# Patient Record
Sex: Male | Born: 1985 | Race: Black or African American | Hispanic: No | Marital: Single | Smoking: Current some day smoker
Health system: Southern US, Community
[De-identification: ages and names within clinical notes are randomized; demographics above are authoritative.]

## PROBLEM LIST (undated history)

## (undated) DIAGNOSIS — K219 Gastro-esophageal reflux disease without esophagitis: Secondary | ICD-10-CM

## (undated) DIAGNOSIS — M1990 Unspecified osteoarthritis, unspecified site: Secondary | ICD-10-CM

## (undated) DIAGNOSIS — R011 Cardiac murmur, unspecified: Secondary | ICD-10-CM

## (undated) HISTORY — PX: APPENDECTOMY: SHX54

---

## 2018-07-09 ENCOUNTER — Emergency Department (HOSPITAL_COMMUNITY): Payer: No Typology Code available for payment source

## 2018-07-09 ENCOUNTER — Encounter (HOSPITAL_COMMUNITY): Payer: Self-pay

## 2018-07-09 ENCOUNTER — Emergency Department (HOSPITAL_COMMUNITY)
Admission: EM | Admit: 2018-07-09 | Discharge: 2018-07-09 | Disposition: A | Discharge status: Home / Self Care | Payer: No Typology Code available for payment source | Attending: Emergency Medicine | Admitting: Emergency Medicine

## 2018-07-09 ENCOUNTER — Other Ambulatory Visit: Payer: Self-pay

## 2018-07-09 DIAGNOSIS — W19XXXA Unspecified fall, initial encounter: Secondary | ICD-10-CM

## 2018-07-09 DIAGNOSIS — S8991XA Unspecified injury of right lower leg, initial encounter: Secondary | ICD-10-CM

## 2018-07-09 DIAGNOSIS — F17210 Nicotine dependence, cigarettes, uncomplicated: Secondary | ICD-10-CM | POA: Insufficient documentation

## 2018-07-09 DIAGNOSIS — Y92812 Truck as the place of occurrence of the external cause: Secondary | ICD-10-CM | POA: Diagnosis not present

## 2018-07-09 DIAGNOSIS — Y9301 Activity, walking, marching and hiking: Secondary | ICD-10-CM | POA: Insufficient documentation

## 2018-07-09 DIAGNOSIS — S3991XA Unspecified injury of abdomen, initial encounter: Secondary | ICD-10-CM | POA: Diagnosis not present

## 2018-07-09 DIAGNOSIS — W1789XA Other fall from one level to another, initial encounter: Secondary | ICD-10-CM | POA: Insufficient documentation

## 2018-07-09 DIAGNOSIS — Y998 Other external cause status: Secondary | ICD-10-CM | POA: Diagnosis not present

## 2018-07-09 DIAGNOSIS — F1210 Cannabis abuse, uncomplicated: Secondary | ICD-10-CM | POA: Diagnosis not present

## 2018-07-09 HISTORY — DX: Cardiac murmur, unspecified: R01.1

## 2018-07-09 MED ORDER — HYDROCODONE-ACETAMINOPHEN 5-325 MG PO TABS: 1.0000 | ORAL_TABLET | ORAL | 0 refills | Status: DC | PRN

## 2018-07-09 MED ORDER — METHOCARBAMOL 500 MG PO TABS: 500.0000 mg | ORAL_TABLET | Freq: Two times a day (BID) | ORAL | 0 refills | Status: DC

## 2018-07-09 NOTE — ED Provider Notes (Signed)
[facility]-EMERGENCY DEPT Provider Note   CSN: [id number] Arrival date & time: 07/09/18  1320     History   Chief Complaint Chief Complaint  Patient presents with  . Fall    HPI Jeffrey Bennett is a 32 y.o. male.  HPI  Jeffrey Bennett is a 32 y.o. male, presenting to the ED with a fall that occurred around 7 AM this morning.  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  He complains of pain to the right knee and the left flank.  He also had pain in the left shoulder, but this has resolved. Pain in the left flank is described as a soreness, moderate, nonradiating.  Pain in the right knee is noted as a throbbing, moderate to severe, nonradiating.  Denies head injury, LOC, neck/back pain, hematuria, nausea/vomiting, or any other complaints.  Past Medical History:  Diagnosis Date  . Heart murmur     There are no active problems to display for this patient.   Past Surgical History:  Procedure Laterality Date  . APPENDECTOMY          Home Medications    Prior to Admission medications   Medication Sig Start Date End Date Taking? Authorizing Provider  HYDROcodone-acetaminophen (NORCO/VICODIN) 5-325 MG tablet Take 1-2 tablets by mouth every 4 (four) hours as needed for severe pain. 07/09/18   Joy, Shawn C, PA-C  methocarbamol (ROBAXIN) 500 MG tablet Take 1 tablet (500 mg total) by mouth 2 (two) times daily. 07/09/18   Anselm PancoastJoy, Shawn C, PA-C    Family History History reviewed. No pertinent family history.  Social History Social History   Tobacco Use  . Smoking status: Current Every Day Smoker    Packs/day: 0.50    Types: Cigarettes  . Smokeless tobacco: Never Used  Substance Use Topics  . Alcohol use: Yes  . Drug use: Yes    Types: Marijuana     Allergies   Trimox [amoxicillin]   Review of Systems Review of Systems  Respiratory: Negative for shortness of breath.   Cardiovascular:  Negative for chest pain.  Gastrointestinal: Negative for nausea and vomiting.  Genitourinary: Positive for flank pain. Negative for hematuria.  Musculoskeletal: Positive for arthralgias. Negative for back pain and neck pain.  Skin: Negative for wound.  Neurological: Negative for dizziness, weakness, light-headedness, numbness and headaches.  All other systems reviewed and are negative.    Physical Exam Updated Vital Signs BP (!) 143/88   Pulse 77   Temp 98.5 F (36.9 C) (Oral)   Resp 16   Ht 6\' 1"  (1.854 m)   Wt 86.2 kg   SpO2 100%   BMI 25.07 kg/m   Physical Exam  Constitutional: He appears well-developed and well-nourished. No distress.  HENT:  Head: Normocephalic and atraumatic.  Eyes: Conjunctivae are normal.  Neck: Neck supple.  Cardiovascular: Normal rate, regular rhythm, normal heart sounds and intact distal pulses.  Pulmonary/Chest: Effort normal and breath sounds normal. No respiratory distress.  Patient speaks in full sentences without difficulty.  No noted increased work of breathing.  Abdominal: Soft. There is tenderness. There is no guarding.    Tenderness in the area indicated without ecchymosis, swelling, wounds, or other abnormalities.  Musculoskeletal: He exhibits no edema.  Tenderness to the medial and anterior right knee.  Patella appears to be in correct anatomical position.  No noted laxity, deformity, or swelling. Painful range of motion in  the left knee. Range of motion intact in the left hip and ankle without pain or other abnormality.  Lymphadenopathy:    He has no cervical adenopathy.  Neurological: He is alert.  Sensation grossly intact to light touch in the lower extremities bilaterally. No saddle anesthesias. Strength 5/5 with flexion and extension at the bilateral hips, knees, and ankles. Limping, antalgic gait.  Skin: Skin is warm and dry. He is not diaphoretic.  Psychiatric: He has a normal mood and affect. His behavior is normal.    Nursing note and vitals reviewed.    ED Treatments / Results  Labs (all labs ordered are listed, but only abnormal results are displayed) Labs Reviewed - No data to display  EKG None  Radiology Dg Shoulder Left  Result Date: 07/09/2018 CLINICAL DATA:  Diffuse shoulder pain after fall at work. EXAM: LEFT SHOULDER - 2+ VIEW COMPARISON:  None. FINDINGS: There is no evidence of fracture or dislocation. There is no evidence of arthropathy or other focal bone abnormality. Soft tissues are unremarkable. IMPRESSION: Negative. Electronically Signed   By: Obie Dredge M.D.   On: 07/09/2018 14:50   Dg Knee Complete 4 Views Right  Result Date: 07/09/2018 CLINICAL DATA:  Fall with right knee pain.  Initial encounter. EXAM: RIGHT KNEE - COMPLETE 4+ VIEW COMPARISON:  None. FINDINGS: No evidence of fracture, dislocation, or joint effusion. No evidence of arthropathy or other focal bone abnormality. Soft tissues are unremarkable. IMPRESSION: Negative. Electronically Signed   By: Marnee Spring M.D.   On: 07/09/2018 14:51    Procedures Procedures (including critical care time)  Medications Ordered in ED Medications - No data to display   Initial Impression / Assessment and Plan / ED Course  I have reviewed the triage vital signs and the nursing notes.  Pertinent labs & imaging results that were available during my care of the patient were reviewed by me and considered in my medical decision making (see chart for details).     Patient presents with injuries from a fall.  No acute abnormalities on x-rays of the left shoulder and knee.  Patient does have left flank tenderness without other abnormalities noted.  He is nontoxic-appearing, not tachycardic, and not hypotensive.  Shared decision making regarding abdominal CT, patient opted against. The patient was given instructions for home care as well as strict return precautions. Patient voices understanding of these instructions, accepts the  plan, and is comfortable with discharge.   Vitals:   07/09/18 1327 07/09/18 1332 07/09/18 1553  BP: (!) 143/88  140/88  Pulse: 77  (!) 51  Resp: 16  18  Temp: 98.5 F (36.9 C)    TempSrc: Oral    SpO2: 100%  100%  Weight:  86.2 kg   Height:  6\' 1"  (1.854 m)      Final Clinical Impressions(s) / ED Diagnoses   Final diagnoses:  Fall, initial encounter  Injury of right knee, initial encounter  Injury of flank, initial encounter    ED Discharge Orders         Ordered    methocarbamol (ROBAXIN) 500 MG tablet  2 times daily     07/09/18 1537    HYDROcodone-acetaminophen (NORCO/VICODIN) 5-325 MG tablet  Every 4 hours PRN     07/09/18 1537           Joy, Hillard Danker, PA-C 07/09/18 1749    Charlynne Pander, MD 07/10/18 [phone]

## 2018-07-09 NOTE — Discharge Instructions (Addendum)
You have been seen today for a knee injury. There were no acute abnormalities on the x-rays, including no sign of fracture or dislocation, however, there could be injuries to the soft tissues, such as the ligaments or tendons that are not seen on xrays. There could also be what are called occult fractures that are small fractures not seen on xray. Antiinflammatory medications: Take 600 mg of ibuprofen every 6 hours or 440 mg (over the counter dose) to 500 mg (prescription dose) of naproxen every 12 hours for the next 3 days. After this time, these medications may be used as needed for pain. Take these medications with food to avoid upset stomach. Choose only one of these medications, do not take them together. Acetaminophen (generic for Tylenol): Should you continue to have additional pain while taking the ibuprofen or naproxen, you may add in acetaminophen as needed. Your daily total maximum amount of acetaminophen from all sources should be limited to 4000mg /day for persons without liver problems, or 2000mg /day for those with liver problems. Vicodin: May take Vicodin (hydrocodone-acetaminophen) as needed for severe pain.  Do not drive or perform other dangerous activities while taking the Vicodin.  Please note that each pill of Vicodin contains 325 mg of acetaminophen (Tylenol) and the above dosage limits apply. Robaxin: Robaxin is a muscle relaxer and can help relieve stiff muscles or muscle spasms.  Do not drive or perform other dangerous activities while taking the Robaxin. Ice: May apply ice to the area over the next 24 hours for 15 minutes at a time to reduce swelling. Elevation: Keep the extremity elevated as often as possible to reduce pain and inflammation. Support: Wear the knee immobilizer for support and comfort. Wear this until pain resolves. You will be weight-bearing as tolerated, which means you can slowly start to put weight on the extremity and increase amount and frequency as pain  allows. Exercises: Start by performing these exercises a few times a week, increasing the frequency until you are performing them twice daily.  Follow up: If symptoms are improving, you may follow up with your primary care provider for any continued management. If symptoms are not starting to improve within a week, you should follow up with the orthopedic specialist within two weeks. Return: Return to the ED for numbness, weakness, increasing pain, overall worsening symptoms, loss of function, or if symptoms are not improving, you have tried to follow up with the orthopedic specialist, and have been unable to do so.  You should also return should your flank pain worsen, you have uncontrolled vomiting, you have abdominal swelling, or any other major concerns.

## 2018-07-09 NOTE — ED Triage Notes (Signed)
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 Patient c/o right knee, left flank area, left shoulder. Patient also reports that he hit his head, but no LOC.

## 2018-07-14 ENCOUNTER — Encounter (HOSPITAL_COMMUNITY): Payer: Self-pay

## 2018-07-14 ENCOUNTER — Emergency Department (HOSPITAL_COMMUNITY)
Admission: EM | Admit: 2018-07-14 | Discharge: 2018-07-14 | Disposition: A | Discharge status: Home / Self Care | Payer: No Typology Code available for payment source | Attending: Emergency Medicine | Admitting: Emergency Medicine

## 2018-07-14 DIAGNOSIS — F17210 Nicotine dependence, cigarettes, uncomplicated: Secondary | ICD-10-CM | POA: Diagnosis not present

## 2018-07-14 DIAGNOSIS — M25561 Pain in right knee: Secondary | ICD-10-CM | POA: Insufficient documentation

## 2018-07-14 MED ORDER — HYDROCODONE-ACETAMINOPHEN 5-325 MG PO TABS: 2.0000 | ORAL_TABLET | ORAL | 0 refills | Status: DC

## 2018-07-14 MED ORDER — KETOROLAC TROMETHAMINE 15 MG/ML IJ SOLN
15.0000 mg | Freq: Once | INTRAMUSCULAR | Status: AC
  Administered 2018-07-14: 15 mg via INTRAMUSCULAR
  Filled 2018-07-14: qty 1

## 2018-07-14 NOTE — ED Triage Notes (Signed)
Pt is c/o of rt knee pain 3/10 and reports that he is unable to bend his knee. Recent injury on 07/09/18 at work.

## 2018-07-14 NOTE — ED Provider Notes (Signed)
[facility]-EMERGENCY DEPT Provider Note   CSN: [id number] Arrival date & time: 07/14/18  1429     History   Chief Complaint Chief Complaint  Patient presents with  . Knee Pain    HPI Jeffrey Bennett is a 32 y.o. male.  HPI  Pt is a 32 y/o male with a h/o heart murmur who presents to the ED today c/o right knee pain that began 4 days ago after he fell in a [employer] truck. Pt was evaluated in the ED at that time and had negative xrays of the right knee. He was placed in right knee immobilizer and given crutches. States he was supposed to go back to work today however he is still having severe pain in the right knee and cannot go back to work yet. He is presenting today for re-evaluation of his continued pain. Staes pain is constant and severe in nature. He has been taking hydrocodone and robaxin with no significant relief. States he has been doing warm/cool compresses which has improved swelling somewhat but swelling not completely resolved. He denies fevers, chills, and continued left sided flank pain that has been present since the fall. Pain present with coughing and movement, however resolved without movement. Denies hematuria.   Chart reviewed from patient's visit on 07/10/2018.  Reviewed x-rays from that day which were negative.  Reviewed that patient refused abdominal CT scan.  Past Medical History:  Diagnosis Date  . Heart murmur     There are no active problems to display for this patient.   Past Surgical History:  Procedure Laterality Date  . APPENDECTOMY          Home Medications    Prior to Admission medications   Medication Sig Start Date End Date Taking? Authorizing Provider  methocarbamol (ROBAXIN) 500 MG tablet Take 1 tablet (500 mg total) by mouth 2 (two) times daily. 07/09/18  Yes Joy, Shawn C, PA-C  Tetrahydrozoline HCl (VISINE OP) Place 1-2 drops into both eyes daily as needed (dry eyes).   Yes [provider]    HYDROcodone-acetaminophen (NORCO/VICODIN) 5-325 MG tablet Take 2 tablets by mouth every 8 (eight) weeks. 07/14/18   Fox Salminen S, PA-C    Family History History reviewed. No pertinent family history.  Social History Social History   Tobacco Use  . Smoking status: Current Every Day Smoker    Packs/day: 0.50    Types: Cigarettes  . Smokeless tobacco: Never Used  Substance Use Topics  . Alcohol use: Yes  . Drug use: Not Currently     Allergies   Trimox [amoxicillin]   Review of Systems Review of Systems  Constitutional: Negative for chills and fever.  Gastrointestinal: Negative for abdominal pain, constipation, diarrhea, nausea and vomiting.  Genitourinary: Positive for flank pain.  Musculoskeletal:       Knee pain  Skin: Negative for color change.   Physical Exam Updated Vital Signs BP (!) 146/76   Pulse 65   Temp 98.2 F (36.8 C)   Resp 16   SpO2 97%   Physical Exam  Constitutional: He is oriented to person, place, and time. He appears well-developed and well-nourished. No distress.  Eyes: Conjunctivae are normal.  Cardiovascular: Normal rate.  Pulmonary/Chest: Effort normal.  Abdominal: Soft.  Musculoskeletal:  ttp along the left medial joint line of the right knee. No warmth, erythema, or swelling. No obvious effusion. Decreased ROM With flexion of the knee. Extension intact. No obvious deformity. Normal sensation.  Neurological: He is alert  and oriented to person, place, and time.  Skin: Skin is warm and dry. Capillary refill takes less than 2 seconds.     ED Treatments / Results  Labs (all labs ordered are listed, but only abnormal results are displayed) Labs Reviewed - No data to display  EKG None  Radiology No results found.  Procedures Procedures (including critical care time)  Medications Ordered in ED Medications  ketorolac (TORADOL) 15 MG/ML injection 15 mg (has no administration in time range)     Initial Impression /  Assessment and Plan / ED Course  I have reviewed the triage vital signs and the nursing notes.  Pertinent labs & imaging results that were available during my care of the patient were reviewed by me and considered in my medical decision making (see chart for details).    Final Clinical Impressions(s) / ED Diagnoses   Final diagnoses:  Acute pain of right knee   Patient presented with continued right knee pain that began after an injury that occurred 4 days ago.  He had negative x-rays 4 days ago.  He has had continued pain and is presented today because he ran out of pain medications and is scheduled to go back to work but he does not feel that he is ready to go back to work.  Will provide short course of additional pain medications and referral to orthopedics.  Patient has no signs of septic arthritis on exam, though I discussed signs and symptoms that would require him to return to the ER immediately and he voiced an understanding of this.  We will also give patient knee sleeve for comfort and advised him to continue using crutches.  Advised to follow-up with orthopedics and return to the ER for any new or worsening symptoms in the meantime.  ED Discharge Orders         Ordered    HYDROcodone-acetaminophen (NORCO/VICODIN) 5-325 MG tablet  Every 8 weeks     07/14/18 [address] Jameison Haji S, PA-C 07/14/18 1559    Benjiman Core, MD 07/14/18 1609

## 2018-07-14 NOTE — Discharge Instructions (Addendum)
You may alternate taking Tylenol and Ibuprofen as needed for pain control. You may take 400-600 mg of ibuprofen every 6 hours and [phone] mg of Tylenol every 6 hours. Do not exceed 4000 mg of Tylenol daily as this can lead to liver damage. Also, make sure to take Ibuprofen with meals as it can cause an upset stomach. Do not take other NSAIDs while taking Ibuprofen such as (Aleve, Naprosyn, Aspirin, Celebrex, etc) and do not take more than the prescribed dose as this can lead to ulcers and bleeding in your GI tract. You may use warm and cold compresses to help with your symptoms.   Please follow up with your primary doctor or with orthopedics within the next 7-10 days for re-evaluation and further treatment of your symptoms.   Please return to the ER sooner if you have any new or worsening symptoms.

## 2019-10-31 ENCOUNTER — Other Ambulatory Visit: Payer: Self-pay

## 2019-10-31 ENCOUNTER — Emergency Department (HOSPITAL_COMMUNITY): Payer: Self-pay

## 2019-10-31 ENCOUNTER — Encounter (HOSPITAL_COMMUNITY): Payer: Self-pay | Admitting: Emergency Medicine

## 2019-10-31 ENCOUNTER — Emergency Department (HOSPITAL_COMMUNITY)
Admission: EM | Admit: 2019-10-31 | Discharge: 2019-10-31 | Disposition: A | Discharge status: Home / Self Care | Payer: Self-pay | Attending: Emergency Medicine | Admitting: Emergency Medicine

## 2019-10-31 DIAGNOSIS — L03011 Cellulitis of right finger: Secondary | ICD-10-CM

## 2019-10-31 DIAGNOSIS — Z79899 Other long term (current) drug therapy: Secondary | ICD-10-CM | POA: Insufficient documentation

## 2019-10-31 DIAGNOSIS — L600 Ingrowing nail: Secondary | ICD-10-CM

## 2019-10-31 DIAGNOSIS — F17210 Nicotine dependence, cigarettes, uncomplicated: Secondary | ICD-10-CM | POA: Insufficient documentation

## 2019-10-31 MED ORDER — LIDOCAINE HCL (PF) 1 % IJ SOLN
10.0000 mL | Freq: Once | INTRAMUSCULAR | Status: AC
  Administered 2019-10-31: 10 mL via INTRADERMAL
  Filled 2019-10-31: qty 30

## 2019-10-31 MED ORDER — HYDROCODONE-ACETAMINOPHEN 5-325 MG PO TABS: 1.0000 | ORAL_TABLET | Freq: Four times a day (QID) | ORAL | 0 refills | Status: DC | PRN

## 2019-10-31 MED ORDER — CEPHALEXIN 500 MG PO CAPS: 500.0000 mg | ORAL_CAPSULE | Freq: Three times a day (TID) | ORAL | 0 refills | Status: AC

## 2019-10-31 NOTE — Discharge Instructions (Signed)
Keep the finger tip clean and dry. Keep it covered while at work. Take the antibiotic to prevent infection. Norco for severe pain when not working or driving.

## 2019-10-31 NOTE — ED Triage Notes (Signed)
Pt reports having pain and swelling to right index finger over the last 4 days. Pt denies any injury.

## 2019-10-31 NOTE — ED Provider Notes (Signed)
[facility] DEPT Provider Note   CSN: [id number] Arrival date & time: 10/31/19  0404     History Chief Complaint  Patient presents with  . Finger Injury    Jeffrey Bennett is a 33 y.o. male.  Patient to ED with painful swelling around nail of right index finger, progressing over 4 days. No drainage or redness. No known injury. 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   The history is provided by the patient. No language interpreter was used.       Past Medical History:  Diagnosis Date  . Heart murmur     There are no problems to display for this patient.   Past Surgical History:  Procedure Laterality Date  . APPENDECTOMY         History reviewed. No pertinent family history.  Social History   Tobacco Use  . Smoking status: Current Every Day Smoker    Packs/day: 0.50    Types: Cigarettes  . Smokeless tobacco: Never Used  Substance Use Topics  . Alcohol use: Yes  . Drug use: Not Currently    Home Medications Prior to Admission medications   Medication Sig Start Date End Date Taking? Authorizing Provider  cephALEXin (KEFLEX) 500 MG capsule Take 1 capsule (500 mg total) by mouth 3 (three) times daily for 5 days. 10/31/19 11/05/19  Charlann Lange, PA-C  HYDROcodone-acetaminophen (NORCO/VICODIN) 5-325 MG tablet Take 1 tablet by mouth every 6 (six) hours as needed. 10/31/19   Charlann Lange, PA-C  methocarbamol (ROBAXIN) 500 MG tablet Take 1 tablet (500 mg total) by mouth 2 (two) times daily. 07/09/18   Joy, Shawn C, PA-C  Tetrahydrozoline HCl (VISINE OP) Place 1-2 drops into both eyes daily as needed (dry eyes).    [provider]    Allergies    Trimox [amoxicillin]  Review of Systems   Review of Systems  Constitutional: Negative for fever.  Musculoskeletal:       See HPI  Skin: Positive for wound.  Neurological: Negative for numbness.    Physical Exam Updated Vital Signs BP (!) 142/87 (BP Location: Left Arm)   Pulse  65   Temp 97.9 F (36.6 C) (Oral)   Resp 16   Ht 6\' 1"  (1.854 m)   Wt 86.2 kg   SpO2 100%   BMI 25.07 kg/m   Physical Exam Constitutional:      Appearance: He is well-developed.  Pulmonary:     Effort: Pulmonary effort is normal.  Musculoskeletal:        General: Normal range of motion.     Cervical back: Normal range of motion.     Comments: Right index finger swollen around radial side and inferior cuticle. Minimal fluctuance. No bleeding. No subungual hematoma.   Skin:    General: Skin is warm and dry.  Neurological:     Mental Status: He is alert and oriented to person, place, and time.     ED Results / Procedures / Treatments   Labs (all labs ordered are listed, but only abnormal results are displayed) Labs Reviewed - No data to display  EKG None  Radiology DG Finger Index Right  Result Date: 10/31/2019 CLINICAL DATA:  Index finger pain EXAM: RIGHT INDEX FINGER 2+V COMPARISON:  None. FINDINGS: There is no evidence of fracture or dislocation. There is no evidence of arthropathy or other focal bone abnormality. Soft tissues are unremarkable. IMPRESSION: Negative. Electronically Signed   By: Ulyses Jarred M.D.   On: 10/31/2019  05:18    Procedures .Nail Removal  Date/Time: 10/31/2019 6:36 AM Performed by: Elpidio Anis, PA-C Authorized by: Elpidio Anis, PA-C   Consent:    Consent obtained:  Verbal   Consent given by:  Patient Location:    Hand:  R index finger Pre-procedure details:    Skin preparation:  Betadine Anesthesia (see MAR for exact dosages):    Anesthesia method:  Nerve block   Block needle gauge:  27 G   Block anesthetic:  Lidocaine 1% w/o epi   Block injection procedure:  Anatomic landmarks identified and introduced needle   Block outcome:  Anesthesia achieved Nail Removal:    Nail removed:  Partial   Nail side:  Radial   Nail bed repaired: no     Removed nail replaced and anchored: no   Trephination:    Subungual hematoma drained:  no   Ingrown nail:    Wedge excision of skin: yes     Nail matrix removed or ablated:  None Post-procedure details:    Patient tolerance of procedure:  Tolerated well, no immediate complications Comments:     There was purulent drainage at the site of nail sliver removal.    (including critical care time)  Medications Ordered in ED Medications  lidocaine (PF) (XYLOCAINE) 1 % injection 10 mL (has no administration in time range)    ED Course  I have reviewed the triage vital signs and the nursing notes.  Pertinent labs & imaging results that were available during my care of the patient were reviewed by me and considered in my medical decision making (see chart for details).    MDM Rules/Calculators/A&P     CHA2DS2/VAS Stroke Risk Points      N/A >= 2 Points: High Risk  1 - 1.99 Points: Medium Risk  0 Points: Low Risk    A final score could not be computed because of missing components.: Last  Change: N/A     This score determines the patient's risk of having a stroke if the  patient has atrial fibrillation.      This score is not applicable to this patient. Components are not  calculated.                   Patient to ED with painful swelling around right index finger nail over 4 days.  He had what appeared to be an ingrown nail. On removal there was pus drainage suggesting small paronychia. Will cover with Keflex x 5 days. Finger splint provided.  Final Clinical Impression(s) / ED Diagnoses Final diagnoses:  Ingrown nail of right index finger  Paronychia of finger of right hand    Rx / DC Orders ED Discharge Orders         Ordered    HYDROcodone-acetaminophen (NORCO/VICODIN) 5-325 MG tablet  Every 6 hours PRN     10/31/19 0628    cephALEXin (KEFLEX) 500 MG capsule  3 times daily     10/31/19 0628           Elpidio Anis, PA-C 10/31/19 5009    Melene Plan, DO 10/31/19 0715

## 2021-07-19 ENCOUNTER — Emergency Department (HOSPITAL_COMMUNITY): Payer: 59

## 2021-07-19 ENCOUNTER — Other Ambulatory Visit: Payer: Self-pay

## 2021-07-19 ENCOUNTER — Emergency Department (HOSPITAL_COMMUNITY)
Admission: EM | Admit: 2021-07-19 | Discharge: 2021-07-19 | Disposition: A | Discharge status: Home / Self Care | Payer: 59 | Attending: Emergency Medicine | Admitting: Emergency Medicine

## 2021-07-19 ENCOUNTER — Encounter (HOSPITAL_COMMUNITY): Payer: Self-pay | Admitting: *Deleted

## 2021-07-19 DIAGNOSIS — F17210 Nicotine dependence, cigarettes, uncomplicated: Secondary | ICD-10-CM | POA: Diagnosis not present

## 2021-07-19 DIAGNOSIS — R1033 Periumbilical pain: Secondary | ICD-10-CM | POA: Diagnosis not present

## 2021-07-19 DIAGNOSIS — G8929 Other chronic pain: Secondary | ICD-10-CM | POA: Diagnosis not present

## 2021-07-19 DIAGNOSIS — M25561 Pain in right knee: Secondary | ICD-10-CM | POA: Diagnosis not present

## 2021-07-19 LAB — COMPREHENSIVE METABOLIC PANEL
ALT: 28 U/L (ref 0–44)
AST: 22 U/L (ref 15–41)
Albumin: 4 g/dL (ref 3.5–5.0)
Alkaline Phosphatase: 68 U/L (ref 38–126)
Anion gap: 8 (ref 5–15)
BUN: 9 mg/dL (ref 6–20)
CO2: 23 mmol/L (ref 22–32)
Calcium: 9.4 mg/dL (ref 8.9–10.3)
Chloride: 109 mmol/L (ref 98–111)
Creatinine, Ser: 0.9 mg/dL (ref 0.61–1.24)
GFR, Estimated: >60 mL/min (ref >60)
Glucose, Bld: 93 mg/dL (ref 70–99)
Potassium: 4.2 mmol/L (ref 3.5–5.1)
Sodium: 140 mmol/L (ref 135–145)
Total Bilirubin: 0.6 mg/dL (ref 0.3–1.2)
Total Protein: 6.4 g/dL — ABNORMAL LOW (ref 6.5–8.1)

## 2021-07-19 LAB — URINALYSIS, ROUTINE W REFLEX MICROSCOPIC
Bilirubin Urine: NEGATIVE
Glucose, UA: NEGATIVE mg/dL
Hgb urine dipstick: NEGATIVE
Ketones, ur: NEGATIVE mg/dL
Leukocytes,Ua: NEGATIVE
Nitrite: NEGATIVE
Protein, ur: NEGATIVE mg/dL
Specific Gravity, Urine: 1.027 (ref 1.005–1.030)
pH: 5 (ref 5.0–8.0)

## 2021-07-19 LAB — CBC WITH DIFFERENTIAL/PLATELET
Abs Immature Granulocytes: 0.02 10*3/uL (ref 0.00–0.07)
Basophils Absolute: 0 10*3/uL (ref 0.0–0.1)
Basophils Relative: 1 %
Eosinophils Absolute: 0.2 10*3/uL (ref 0.0–0.5)
Eosinophils Relative: 3 %
HCT: 45.4 % (ref 39.0–52.0)
Hemoglobin: 14.8 g/dL (ref 13.0–17.0)
Immature Granulocytes: 0 %
Lymphocytes Relative: 47 %
Lymphs Abs: 2.5 10*3/uL (ref 0.7–4.0)
MCH: 30.4 pg (ref 26.0–34.0)
MCHC: 32.6 g/dL (ref 30.0–36.0)
MCV: 93.2 fL (ref 80.0–100.0)
Monocytes Absolute: 0.4 10*3/uL (ref 0.1–1.0)
Monocytes Relative: 8 %
Neutro Abs: 2.2 10*3/uL (ref 1.7–7.7)
Neutrophils Relative %: 41 %
Platelets: 303 10*3/uL (ref 150–400)
RBC: 4.87 MIL/uL (ref 4.22–5.81)
RDW: 13.7 % (ref 11.5–15.5)
WBC: 5.3 10*3/uL (ref 4.0–10.5)
nRBC: 0 % (ref 0.0–0.2)

## 2021-07-19 LAB — LIPASE, BLOOD: Lipase: 21 U/L (ref 11–51)

## 2021-07-19 MED ORDER — DICYCLOMINE HCL 20 MG PO TABS: 20.0000 mg | ORAL_TABLET | Freq: Two times a day (BID) | ORAL | 0 refills | Status: DC

## 2021-07-19 MED ORDER — SIMETHICONE 40 MG/0.6ML PO SUSP
40.0000 mg | Freq: Once | ORAL | Status: AC
  Administered 2021-07-19: 40 mg via ORAL
  Filled 2021-07-19: qty 0.6

## 2021-07-19 MED ORDER — IOHEXOL 350 MG/ML SOLN
100.0000 mL | Freq: Once | INTRAVENOUS | Status: AC | PRN
  Administered 2021-07-19: 100 mL via INTRAVENOUS

## 2021-07-19 MED ORDER — FAMOTIDINE 20 MG PO TABS: 20.0000 mg | ORAL_TABLET | Freq: Two times a day (BID) | ORAL | 0 refills | Status: DC

## 2021-07-19 MED ORDER — SUCRALFATE 1 G PO TABS: 1.0000 g | ORAL_TABLET | Freq: Three times a day (TID) | ORAL | 0 refills | Status: DC

## 2021-07-19 NOTE — ED Notes (Signed)
Provided patient with urinal for sample

## 2021-07-19 NOTE — ED Triage Notes (Signed)
Pt has multiple complaints. Reports mid abd pain for several weeks, gets worse after eating and drinking. Has lower back pain and knee pain. No acute distress noted at triage.

## 2021-07-19 NOTE — ED Provider Notes (Signed)
[facility] EMERGENCY DEPARTMENT Provider Note   CSN: [id number] Arrival date & time: 07/19/21  0086     History Chief Complaint  Patient presents with   Abdominal Pain    Jeffrey Bennett is a 35 y.o. male.  HPI Patient presents with abdominal pain.  Pain is in the periumbilical superior region, has been present for a few weeks, possibly worse over the past few days.  There is a bloating sensation, worse with oral intake.  No diarrhea, melena, bright red blood per rectum.  No cough, fever, chest pain, dyspnea.  No urinary complaints. Has a history of prior appendectomy.  He does not use NSAIDs regularly, does use alcohol. After initial evaluation, describing his ongoing abdominal pain, for which he has not seen a physician yet, he requests evaluation of his right knee, which is had been giving him issues for years since prior workplace injury.  Does not describe any new injury.    Past Medical History:  Diagnosis Date   Heart murmur     There are no problems to display for this patient.   Past Surgical History:  Procedure Laterality Date   APPENDECTOMY         History reviewed. No pertinent family history.  Social History   Tobacco Use   Smoking status: Every Day    Packs/day: 0.50    Types: Cigarettes   Smokeless tobacco: Never  Vaping Use   Vaping Use: Never used  Substance Use Topics   Alcohol use: Yes   Drug use: Not Currently    Home Medications Prior to Admission medications   Medication Sig Start Date End Date Taking? Authorizing Provider  acetaminophen (TYLENOL) 325 MG tablet Take 650 mg by mouth every 6 (six) hours as needed for mild pain, fever or headache.   Yes [provider]    Allergies    Trimox [amoxicillin]  Review of Systems   Review of Systems  Constitutional:        Per HPI, otherwise negative  HENT:         Per HPI, otherwise negative  Respiratory:         Per HPI, otherwise negative   Cardiovascular:        Per HPI, otherwise negative  Gastrointestinal:  Positive for abdominal pain. Negative for vomiting.  Endocrine:       Negative aside from HPI  Genitourinary:        Neg aside from HPI   Musculoskeletal:        Per HPI, otherwise negative  Skin: Negative.   Neurological:  Negative for syncope.   Physical Exam Updated Vital Signs BP (!) 142/84   Pulse (!) 59   Temp 98.1 F (36.7 C) (Oral)   Resp 10   SpO2 100%   Physical Exam Vitals and nursing note reviewed.  Constitutional:      General: He is not in acute distress.    Appearance: He is well-developed.  HENT:     Head: Normocephalic and atraumatic.  Eyes:     Conjunctiva/sclera: Conjunctivae normal.  Cardiovascular:     Rate and Rhythm: Normal rate and regular rhythm.  Pulmonary:     Effort: Pulmonary effort is normal. No respiratory distress.     Breath sounds: No stridor.  Abdominal:     General: There is no distension.     Tenderness: There is no abdominal tenderness. There is no guarding.  Musculoskeletal:     Comments: Musculoskeletal exam unremarkable.  Right knee without discoloration, swelling, deformity.  Skin:    General: Skin is warm and dry.  Neurological:     Mental Status: He is alert and oriented to person, place, and time.    ED Results / Procedures / Treatments   Labs (all labs ordered are listed, but only abnormal results are displayed) Labs Reviewed  COMPREHENSIVE METABOLIC PANEL - Abnormal; Notable for the following components:      Result Value   Total Protein 6.4 (*)    All other components within normal limits  CBC WITH DIFFERENTIAL/PLATELET  LIPASE, BLOOD  URINALYSIS, ROUTINE W REFLEX MICROSCOPIC    EKG None  Radiology CT Abdomen Pelvis W Contrast  Result Date: 07/19/2021 CLINICAL DATA:  Abdominal pain, acute, nonlocalized EXAM: CT ABDOMEN AND PELVIS WITH CONTRAST TECHNIQUE: Multidetector CT imaging of the abdomen and pelvis was performed using the  standard protocol following bolus administration of intravenous contrast. CONTRAST:  OMNIPAQUE IOHEXOL 350 MG/ML SOLN COMPARISON:  None. FINDINGS: Lower chest: No acute abnormality. Hepatobiliary: No focal liver abnormality is seen. No gallstones, gallbladder wall thickening, or biliary dilatation. Pancreas: Unremarkable. No pancreatic ductal dilatation or surrounding inflammatory changes. Spleen: Normal in size without focal abnormality. Adrenals/Urinary Tract: Adrenal glands are unremarkable. Kidneys are normal, without renal calculi, focal lesion, or hydronephrosis. The bladder is minimally distended but unremarkable. Stomach/Bowel: Stomach is within normal limits. There is no evidence of bowel obstruction. The appendix is normal. No bowel wall thickening or acute inflammatory changes. Vascular/Lymphatic: No significant vascular findings are present. Likely contrast mixing within the superior mesenteric vein. No enlarged abdominal or pelvic lymph nodes. Reproductive: Prostate calcifications, otherwise unremarkable. Other: No abdominal wall hernia or abnormality. No abdominopelvic ascites. Musculoskeletal: No acute or significant osseous findings. IMPRESSION: No acute abdominopelvic abnormality. Electronically Signed   By: Caprice Renshaw M.D.   On: 07/19/2021 13:28    Procedures Procedures   Medications Ordered in ED Medications  simethicone (MYLICON) 40 MG/0.6ML suspension 40 mg (has no administration in time range)  iohexol (OMNIPAQUE) 350 MG/ML injection 100 mL (100 mLs Intravenous Contrast Given 07/19/21 1258)    ED Course  I have reviewed the triage vital signs and the nursing notes.  Pertinent labs & imaging results that were available during my care of the patient were reviewed by me and considered in my medical decision making (see chart for details).  On repeat exam patient is awake, alert, in no distress, speaking clearly.  He is hemodynamically unremarkable.  He continues to have a  soft, nonperitoneal abdomen. Labs, CT, urinalysis reviewed, no discussed, no notable findings.  Suspicion for gastric versus other inflammatory disorder no evidence for perforation, peritonitis, bacteremia, sepsis, pancreatitis.  Patient started on appropriate meds, can follow-up with primary care.  Final Clinical Impression(s) / ED Diagnoses Final diagnoses:  Periumbilical abdominal pain  Chronic pain of right knee    Rx / DC Orders ED Discharge Orders          Ordered    dicyclomine (BENTYL) 20 MG tablet  2 times daily        07/19/21 1438    famotidine (PEPCID) 20 MG tablet  2 times daily        07/19/21 1438    sucralfate (CARAFATE) 1 g tablet  3 times daily with meals & bedtime       Note to Pharmacy: Take for one week   07/19/21 1438             Gerhard Munch, MD 07/19/21  1502  

## 2021-07-19 NOTE — Discharge Instructions (Addendum)
As discussed, your evaluation today has been largely reassuring.  But, it is important that you monitor your condition carefully, and do not hesitate to return to the ED if you develop new, or concerning changes in your condition. ? ?Otherwise, please follow-up with your physician for appropriate ongoing care. ? ?

## 2021-07-19 NOTE — ED Provider Notes (Signed)
Emergency Medicine Provider Triage Evaluation Note  Jeffrey Bennett , a 35 y.o. male  was evaluated in triage.  Pt complains of abd pain. Began a few weeks ago. Worsening over last few days. Located to epigastric and periumbilical region. Feels bloated. Worse with eating. Last BM this morning. No melena or BRBPR. Feels persistent nausea without emesis. No cough,fever, CP, SOB. No urinary complaints. Pain radiates into back. No chronic NSAID use, Etoh use. No known connective tissue disorders, AA, dissections  Review of Systems  Positive: Abd pain, bloating, nausea Negative: Urinary complaints., emesis, diarrhea, fever  Physical Exam  BP 118/85 (BP Location: Right Arm)   Pulse 62   Temp 98.1 F (36.7 C) (Oral)   Resp 20   SpO2 99%  Gen:   Awake, no distress   Resp:  Normal effort  ABD:  Soft, diffusely tender to epigastric and periumbilical region MSK:   Moves extremities without difficulty  Other:    Medical Decision Making  Medically screening exam initiated at 10:01 AM.  Appropriate orders placed.  Jeffrey Bennett was informed that the remainder of the evaluation will be completed by another provider, this initial triage assessment does not replace that evaluation, and the importance of remaining in the ED until their evaluation is complete.  Abdominal pain, back pain, bloating   Jeffrey Bennett A, PA-C 07/19/21 1004    Gerhard Munch, MD 07/19/21 1656

## 2021-08-03 ENCOUNTER — Ambulatory Visit: Payer: Self-pay | Admitting: *Deleted

## 2021-08-03 NOTE — Telephone Encounter (Signed)
Pt is calling because he is having abdominal pain 3/10 for a few months. Pt is having  a bloating sensation, worse with oral intake. Pt want to the ED on 07/19/21. Medication was given but he is out of medication. NPA was scheduled for early part of October. Please advise.   Call to patient- patient states the medications that he received at ED helped elevate his symptoms- but he is out now and the pain has returned. Pain gets worse when he eats. Advised UC until he can establish with his new provider.  Reason for Disposition  [1] MILD-MODERATE pain AND [2] constant AND [3] present > 2 hours  Answer Assessment - Initial Assessment Questions 1. LOCATION: "Where does it hurt?"      At and under umbilicus- midline 2. RADIATION: "Does the pain shoot anywhere else?" (e.g., chest, back)     No- does have back pain 3. ONSET: "When did the pain begin?" (Minutes, hours or days ago)      2 months 4. SUDDEN: "Gradual or sudden onset?"     sudden 5. PATTERN "Does the pain come and go, or is it constant?"    - If constant: "Is it getting better, staying the same, or worsening?"      (Note: Constant means the pain never goes away completely; most serious pain is constant and it progresses)     - If intermittent: "How long does it last?" "Do you have pain now?"     (Note: Intermittent means the pain goes away completely between bouts)     Constant- gets worse with food 6. SEVERITY: "How bad is the pain?"  (e.g., Scale 1-10; mild, moderate, or severe)    - MILD (1-3): doesn't interfere with normal activities, abdomen soft and not tender to touch     - MODERATE (4-7): interferes with normal activities or awakens from sleep, abdomen tender to touch     - SEVERE (8-10): excruciating pain, doubled over, unable to do any normal activities       Mild/moderate 7. RECURRENT SYMPTOM: "Have you ever had this type of stomach pain before?" If Yes, ask: "When was the last time?" and "What happened that time?"      No-  was seen at ED and had benefit by taking the medications provided  8. CAUSE: "What do you think is causing the stomach pain?"     Acid reflux, ulcer 9. RELIEVING/AGGRAVATING FACTORS: "What makes it better or worse?" (e.g., movement, antacids, bowel movement)     Only medications prescribed 10. OTHER SYMPTOMS: "Do you have any other symptoms?" (e.g., back pain, diarrhea, fever, urination pain, vomiting)       Lower back pain  Protocols used: Abdominal Pain - Male-A-AH

## 2021-08-21 ENCOUNTER — Other Ambulatory Visit (HOSPITAL_BASED_OUTPATIENT_CLINIC_OR_DEPARTMENT_OTHER): Payer: Self-pay

## 2021-08-21 ENCOUNTER — Ambulatory Visit (INDEPENDENT_AMBULATORY_CARE_PROVIDER_SITE_OTHER): Payer: 59 | Admitting: Nurse Practitioner

## 2021-08-21 ENCOUNTER — Other Ambulatory Visit: Payer: Self-pay

## 2021-08-21 ENCOUNTER — Encounter (HOSPITAL_BASED_OUTPATIENT_CLINIC_OR_DEPARTMENT_OTHER): Payer: Self-pay | Admitting: Nurse Practitioner

## 2021-08-21 VITALS — BP 110/62 | HR 64 | Ht 73.0 in | Wt 193.6 lb

## 2021-08-21 DIAGNOSIS — R1010 Upper abdominal pain, unspecified: Secondary | ICD-10-CM | POA: Diagnosis not present

## 2021-08-21 DIAGNOSIS — L732 Hidradenitis suppurativa: Secondary | ICD-10-CM | POA: Diagnosis not present

## 2021-08-21 DIAGNOSIS — Z0000 Encounter for general adult medical examination without abnormal findings: Secondary | ICD-10-CM

## 2021-08-21 HISTORY — DX: Upper abdominal pain, unspecified: R10.10

## 2021-08-21 HISTORY — DX: Encounter for general adult medical examination without abnormal findings: Z00.00

## 2021-08-21 MED ORDER — DOXYCYCLINE HYCLATE 100 MG PO TABS
ORAL_TABLET | ORAL | 6 refills | Status: DC
  Filled 2021-08-21: qty 42, 28d supply, fill #0

## 2021-08-21 MED ORDER — DOXYCYCLINE HYCLATE 100 MG PO TABS: ORAL_TABLET | ORAL | 6 refills | Status: DC

## 2021-08-21 MED ORDER — PANTOPRAZOLE SODIUM 40 MG PO TBEC
40.0000 mg | DELAYED_RELEASE_TABLET | Freq: Every day | ORAL | 3 refills | Status: DC
  Filled 2021-08-21: qty 30, 30d supply, fill #0

## 2021-08-21 MED ORDER — PANTOPRAZOLE SODIUM 40 MG PO TBEC: 40.0000 mg | DELAYED_RELEASE_TABLET | Freq: Every day | ORAL | 3 refills | Status: DC

## 2021-08-21 NOTE — Assessment & Plan Note (Signed)
Significant history of HS with previous control with bactrim Extensive scarring is present from previous abscess locations Will trial long term treatment with doxycycline BID for 2 weeks then down to daily. Recommend continue treatment for at least 60 days then consider taper. Warm compresses to area may be helpful to facilitate drainage of current abscesses.  Can consider clindamycin gel for topical treatment and spironolactone for androgen blockage if symptoms do not improve or improve and return.  Patient will follow-up in 4 weeks for monitoring.

## 2021-08-21 NOTE — Assessment & Plan Note (Signed)
Review of current and past medical history, social history, medication, and family history.  Review of care gaps and health maintenance recommendations.  Records from recent providers to be requested if not available in Chart Review or Care Everywhere.  Recommendations for health maintenance, diet, and exercise provided.  Labs today: reviewed from ED- H Pylori test here HM Recommendations: vaccines- will wait until feeling better CPE due: near future

## 2021-08-21 NOTE — Progress Notes (Signed)
[name], DNP, AGNP-c Primary Care & Sports Medicine [address]  [address] [place], [place] [address] [phone]  New patient visit   Patient: Jeffrey Bennett   DOB: 1986-04-07   35 y.o. Male  MRN: [id number] Visit Date: 08/21/2021  Patient Care Team: Robertha Staples, Sung Amabile, NP as PCP - General (Nurse Practitioner)  Today's healthcare provider: [name], NP   Chief Complaint  Patient presents with   Establish Care    Patient states he does not eat much and feels bloated all the time x 3 months.  Sharps pains when he eats.  Pains range from 6-8.  He reports his stools are soft since having this problem.   Subjective    Jeffrey Bennett is a 35 y.o. male who presents today as a new patient to establish care.  HPI HPI     Establish Care    Additional comments: Patient states he does not eat much and feels bloated all the time x 3 months.  Sharps pains when he eats.  Pains range from 6-8.  He reports his stools are soft since having this problem.      Last edited by Heloise Ochoa, CMA on 08/21/2021  1:46 PM.      ABDOMINAL PAIN  Duration:months Onset: gradual Severity:  between 3/10 - 8/10 Quality: sharp, aching, cramping, ill-defined, and stabbing-  feels very bloated Location:  epigastric and peri-umbilical  Episode duration: varies Radiation:  has noticed low back pain, but not sure if this is related Frequency: intermittent Alleviating factors: nothing Aggravating factors: nothing- pepcid was moderately helpful Status: fluctuating Treatments attempted: antacids and H2 Blocker Fever: no Nausea: no Vomiting: no Weight loss: yes Decreased appetite: yes Diarrhea: yes Constipation: no Blood in stool: no Heartburn: yes Jaundice: no Rash: no Dysuria/urinary frequency: no Hematuria: no History of sexually transmitted disease: no Recurrent NSAID use: no Monitoring diet extensively- no known food triggers Has stopped all alcohol  intake   Hidradinitis Suppurativa Endorses concerns with "abscesses" under his arms, in groin, on sides of face, and on his neck since he was a teenager Endorses intermittent outcroppings of large, painful boils that drain Has used bactrim in the past with success used intermittently Concerned due to scarring and excessive repetition of boils.  He endorses showering up to 5 times a day to help reduce the instances- but this is not helpful No fever, chills, enlarged lymph nodes.   Past Medical History:  Diagnosis Date   Heart murmur    Past Surgical History:  Procedure Laterality Date   APPENDECTOMY     Family Status  Relation Name Status   Mother  Alive   Father  Alive   MGM  Deceased   MGF  Alive   Family History  Problem Relation Age of Onset   Cancer Father        stomach cancer   Cancer Maternal Grandmother    Cancer Maternal Grandfather    Social History   Socioeconomic History   Marital status: Single    Spouse name: Not on file   Number of children: Not on file   Years of education: Not on file   Highest education level: Not on file  Occupational History   Not on file  Tobacco Use   Smoking status: Former    Packs/day: 0.50    Types: Cigarettes    Quit date: 2020    Years since quitting: 2.7   Smokeless tobacco: Never  Vaping  Use   Vaping Use: Never used  Substance and Sexual Activity   Alcohol use: Yes    Alcohol/week: 18.0 standard drinks    Types: 18 Cans of beer per week   Drug use: Not Currently   Sexual activity: Yes    Birth control/protection: Condom  Other Topics Concern   Not on file  Social History Narrative   Not on file   Social Determinants of Health   Financial Resource Strain: Not on file  Food Insecurity: Not on file  Transportation Needs: Not on file  Physical Activity: Not on file  Stress: Not on file  Social Connections: Not on file   Outpatient Medications Prior to Visit  Medication Sig   [DISCONTINUED]  acetaminophen (TYLENOL) 325 MG tablet Take 650 mg by mouth every 6 (six) hours as needed for mild pain, fever or headache. (Patient not taking: Reported on 08/21/2021)   [DISCONTINUED] dicyclomine (BENTYL) 20 MG tablet Take 1 tablet (20 mg total) by mouth 2 (two) times daily. (Patient not taking: Reported on 08/21/2021)   [DISCONTINUED] famotidine (PEPCID) 20 MG tablet Take 1 tablet (20 mg total) by mouth 2 (two) times daily. (Patient not taking: Reported on 08/21/2021)   [DISCONTINUED] sucralfate (CARAFATE) 1 g tablet Take 1 tablet (1 g total) by mouth 4 (four) times daily -  with meals and at bedtime. (Patient not taking: Reported on 08/21/2021)   No facility-administered medications prior to visit.   Allergies  Allergen Reactions   Trimox [Amoxicillin] Other (See Comments)    Unknown reaction childhood allergy. Has patient had a PCN reaction causing immediate rash, facial/tongue/throat swelling, SOB or lightheadedness with hypotension: Unknown Has patient had a PCN reaction causing severe rash involving mucus membranes or skin necrosis: Unknown Has patient had a PCN reaction that required hospitalization: Unknown Has patient had a PCN reaction occurring within the last 10 years: Unknown If all of the above answers are "NO", then may proceed with Cephalosporin use.      There is no immunization history on file for this patient.  Health Maintenance  Topic Date Due   COVID-19 Vaccine (1) Never done   HIV Screening  Never done   Hepatitis C Screening  Never done   TETANUS/TDAP  Never done   INFLUENZA VACCINE  Never done   HPV VACCINES  Aged Out    Patient Care Team: Delicia Berens, Sung Amabile, NP as PCP - General (Nurse Practitioner)  Review of Systems All review of systems negative except what is listed in the HPI    Objective    BP 110/62   Pulse 64   Ht 6\' 1"  (1.854 m)   Wt 193 lb 9.6 oz (87.8 kg)   SpO2 100%   BMI 25.54 kg/m  Physical Exam Vitals and nursing note reviewed.   Constitutional:      Appearance: Normal appearance. He is normal weight.  HENT:     Head: Normocephalic and atraumatic.  Eyes:     Extraocular Movements: Extraocular movements intact.     Conjunctiva/sclera: Conjunctivae normal.     Pupils: Pupils are equal, round, and reactive to light.  Cardiovascular:     Rate and Rhythm: Normal rate and regular rhythm.     Pulses: Normal pulses.     Heart sounds: Normal heart sounds.  Pulmonary:     Effort: Pulmonary effort is normal.     Breath sounds: Normal breath sounds.  Abdominal:     General: Abdomen is flat. Bowel sounds are normal. There  is no distension.     Palpations: Abdomen is soft. There is no mass.     Tenderness: There is abdominal tenderness. There is guarding. There is no right CVA tenderness, left CVA tenderness or rebound.     Hernia: No hernia is present.       Comments: Pain in epigastric/periumbilical region. Tenderness with palpation. No ecchymosis present.  No hernia present. Abdomen soft, non distended. BS in all 4 quad.   Musculoskeletal:        General: Normal range of motion.     Cervical back: Normal range of motion.     Right lower leg: No edema.     Left lower leg: No edema.  Skin:    General: Skin is warm and dry.     Capillary Refill: Capillary refill takes less than 2 seconds.     Findings: Abscess present.     Comments: Currently three abscess in axilla and groin with extensive scarring to the right ear and axilla from previous abscess. Symptoms consistent with hidradenitis suppurativa.  Neurological:     General: No focal deficit present.     Mental Status: He is alert and oriented to person, place, and time.  Psychiatric:        Mood and Affect: Mood normal.        Behavior: Behavior normal.        Thought Content: Thought content normal.        Judgment: Judgment normal.     Depression Screen PHQ 2/9 Scores 08/21/2021  PHQ - 2 Score 0  PHQ- 9 Score 5   No results found for any visits on  08/21/21.  Assessment & Plan      Problem List Items Addressed This Visit     Hidradenitis suppurativa - Primary    Significant history of HS with previous control with bactrim Extensive scarring is present from previous abscess locations Will trial long term treatment with doxycycline BID for 2 weeks then down to daily. Recommend continue treatment for at least 60 days then consider taper. Warm compresses to area may be helpful to facilitate drainage of current abscesses.  Can consider clindamycin gel for topical treatment and spironolactone for androgen blockage if symptoms do not improve or improve and return.  Patient will follow-up in 4 weeks for monitoring.       Relevant Medications   doxycycline (VIBRA-TABS) 100 MG tablet   Pain of upper abdomen    Upper abdominal pain of unknown etiology.  Concerns for possible h.pylori infection based on presentation and symptoms. Labs and imaging results from recent evaluations reviewed today. No signs of blockage, hernia, UTI, STI present. No alarm symptoms present May consider allergen testing if H. Pylori is negative. Will start PPI treatment today. F/U in 4 weeks or sooner if needed.       Relevant Medications   pantoprazole (PROTONIX) 40 MG tablet   Other Relevant Orders   H. pylori breath test   Encounter for medical examination to establish care    Review of current and past medical history, social history, medication, and family history.  Review of care gaps and health maintenance recommendations.  Records from recent providers to be requested if not available in Chart Review or Care Everywhere.  Recommendations for health maintenance, diet, and exercise provided.  Labs today: reviewed from ED- H Pylori test here HM Recommendations: vaccines- will wait until feeling better CPE due: near future         Return in  about 4 weeks (around 09/18/2021) for Hidradenitis and Abdominal Pain.    .Time: 50 minutes, >50% spent  counseling, care coordination, chart review, and documentation.    Hilde Churchman, Sung Amabile, NP, DNP, AGNP-C Primary Care & Sports Medicine at [facility]

## 2021-08-21 NOTE — Assessment & Plan Note (Signed)
Upper abdominal pain of unknown etiology.  Concerns for possible h.pylori infection based on presentation and symptoms. Labs and imaging results from recent evaluations reviewed today. No signs of blockage, hernia, UTI, STI present. No alarm symptoms present May consider allergen testing if H. Pylori is negative. Will start PPI treatment today. F/U in 4 weeks or sooner if needed.

## 2021-08-21 NOTE — Patient Instructions (Addendum)
Recommendations from today's visit: I have tested you today for H. Pylori Infection. If this test comes back positive, we will start treatment for this. If not, then we will discuss next options I am starting pantoprazole today 40mg  once a day to help with belly pain. This should help reduce the pain and have you feeling better.  I have sent in a medication called doxycycline for the hidradenitis suppurativa.  Take one tab in the morning and one tab in the evening for 2 weeks straight.  At the end of two weeks, go down to one tab one time a day.  If symptoms get worse again, you can go back up to the twice a day dose for 2 more weeks then taper back down to once a day.  This should help keep these at [place] and not coming as often or getting as large.  I would like to follow-up in 4 weeks with a phone visit to see how you are doing and if there are any changes, unless something changes between now and then   Information on diet, exercise, and health maintenance recommendations are listed below. This is information to help you be sure you are on track for optimal health and monitoring.   Please look over this and let us know if you have any questions or if you have completed any of the health maintenance outside of [facility] so that we can be sure your records are up to date.  ___________________________________________________________  Thank you for choosing [facility] for your Primary Care needs. I am excited for the opportunity to partner with you to meet your health care goals. It was a pleasure meeting you today!  I am an Adult-Geriatric Nurse Practitioner with a background in caring for patients for more than 20 years. I provide primary care and sports medicine services to patients age 28 and older within this office. I am also the director of the [employer] Fellowship with [facility].   I am passionate about providing the best service to you through preventive  medicine and supportive care. I consider you a part of the medical team and value your input. I work diligently to ensure that you are heard and your needs are met in a safe and effective manner. I want you to feel comfortable with me as your provider and want you to know that your health concerns are important to me.  For your information, our office hours are Monday- Friday 8:00 AM - 5:00 PM At this time I am not in the office on Wednesdays.  If you have questions or concerns, please call our office at [phone] or send [place] a MyChart message and we will respond as quickly as possible.   For all urgent or time sensitive needs we ask that you please call the office to avoid delays. MyChart is not constantly monitored and replies may take up to 72 business hours.  MyChart Policy: MyChart allows for you to see your visit notes, after visit summary, provider recommendations, lab and tests results, make an appointment, request refills, and contact your provider or the office for non-urgent questions or concerns. Providers are seeing patients during normal business hours and do not have built in time to review MyChart messages.  We ask that you allow a minimum of 4 business days for responses to [employer]. For this reason, please do not send urgent requests through [place]. Please call the office at [phone]. Complex MyChart concerns may  require a visit. Your provider may request you schedule a virtual or in person visit to ensure we are providing the best care possible. MyChart messages sent after 4:00 PM on Friday will not be received by the provider until Monday morning.    Lab and Test Results: You will receive your lab and test results on MyChart as soon as they are completed and results have been sent by the lab or testing facility. Due to this service, you will receive your results BEFORE your provider.  I review lab and tests results each morning prior to seeing patients. Some  results require collaboration with other providers to ensure you are receiving the most appropriate care. For this reason, we ask that you please allow a minimum of 4 business days for your provider to receive and review lab and test results and contact you about these.  Most lab and test result comments from the provider will be sent through [place]. Your provider may recommend changes to the plan of care, follow-up visits, repeat testing, ask questions, or request an office visit to discuss these results. You may reply directly to this message or call the office at [phone] to provide information for the provider or set up an appointment. In some instances, you will be called with test results and recommendations. Please let us know if this is preferred and we will make note of this in your chart to provide this for you.    If you have not heard a response to your lab or test results in 72 business hours, please call the office to let us know.   After Hours: For all non-emergency after hours needs, please call the office at [phone] and select the option to reach the on-call provider service. On-call services are shared between multiple [facility] offices and therefore it will not be possible to speak directly with your provider. On-call providers may provide medical advice and recommendations, but are unable to provide refills for maintenance medications.  For all emergency or urgent medical needs after normal business hours, we recommend that you seek care at the closest Urgent Care or Emergency Department to ensure appropriate treatment in a timely manner.  [facility] [place] has a 24 hour emergency room located on the ground floor for your convenience.    Please do not hesitate to reach out to [place] with concerns.   Thank you, again, for choosing me as your health care partner. I appreciate your trust and look forward to learning more about you.   Worthy Keeler, DNP,  AGNP-c ___________________________________________________________  Health Maintenance Recommendations Screening Testing Mammogram Every 1 -2 years based on history and risk factors Starting at age 13 Pap Smear Ages 21-39 every 3 years Ages 53-65 every 5 years with HPV testing More frequent testing may be required based on results and history Colon Cancer Screening Every 1-10 years based on test performed, risk factors, and history Starting at age 65 Bone Density Screening Every 2-10 years based on history Starting at age 23 for women Recommendations for men differ based on medication usage, history, and risk factors AAA Screening One time ultrasound Men 4-50 years old who have every smoked Lung Cancer Screening Low Dose Lung CT every 12 months Age 60-80 years with a 30 pack-year smoking history who still smoke or who have quit within the last 15 years  Screening Labs Routine  Labs: Complete Blood Count (CBC), Complete Metabolic Panel (CMP), Cholesterol (Lipid Panel) Every 6-12 months based on history  and medications May be recommended more frequently based on current conditions or previous results Hemoglobin A1c Lab Every 3-12 months based on history and previous results Starting at age 29 or earlier with diagnosis of diabetes, high cholesterol, BMI >26, and/or risk factors Frequent monitoring for patients with diabetes to ensure blood sugar control Thyroid Panel (TSH w/ T3 & T4) Every 6 months based on history, symptoms, and risk factors May be repeated more often if on medication HIV One time testing for all patients 58 and older May be repeated more frequently for patients with increased risk factors or exposure Hepatitis C One time testing for all patients 65 and older May be repeated more frequently for patients with increased risk factors or exposure Gonorrhea, Chlamydia Every 12 months for all sexually active persons 13-24 years Additional monitoring may be  recommended for those who are considered high risk or who have symptoms PSA Men 52-80 years old with risk factors Additional screening may be recommended from age 63-69 based on risk factors, symptoms, and history  Vaccine Recommendations Tetanus Booster All adults every 10 years Flu Vaccine All patients 6 months and older every year COVID Vaccine All patients 12 years and older Initial dosing with booster May recommend additional booster based on age and health history HPV Vaccine 2 doses all patients age 56-26 Dosing may be considered for patients over 26 Shingles Vaccine (Shingrix) 2 doses all adults 71 years and older Pneumonia (Pneumovax 23) All adults 65 years and older May recommend earlier dosing based on health history Pneumonia (Prevnar 68) All adults 85 years and older Dosed 1 year after Pneumovax 23  Additional Screening, Testing, and Vaccinations may be recommended on an individualized basis based on family history, health history, risk factors, and/or exposure.  __________________________________________________________  Diet Recommendations for All Patients  I recommend that all patients maintain a diet low in saturated fats, carbohydrates, and cholesterol. While this can be challenging at first, it is not impossible and small changes can make big differences.  Things to try: Decreasing the amount of soda, sweet tea, and/or juice to one or less per day and replace with water While water is always the first choice, if you do not like water you may consider adding a water additive without sugar to improve the taste other sugar free drinks Replace potatoes with a brightly colored vegetable at dinner Use healthy oils, such as canola oil or olive oil, instead of butter or hard margarine Limit your bread intake to two pieces or less a day Replace regular pasta with low carb pasta options Bake, broil, or grill foods instead of frying Monitor portion sizes  Eat  smaller, more frequent meals throughout the day instead of large meals  An important thing to remember is, if you love foods that are not great for your health, you don't have to give them up completely. Instead, allow these foods to be a reward when you have done well. Allowing yourself to still have special treats every once in a while is a nice way to tell yourself thank you for working hard to keep yourself healthy.   Also remember that every day is a new day. If you have a bad day and "fall off the wagon", you can still climb right back up and keep moving along on your journey!  We have resources available to help you!  Some websites that may be helpful include: www.http://carter.biz/  Www.VeryWellFit.com _____________________________________________________________  Activity Recommendations for All Patients  I recommend that all  adults get at least 20 minutes of moderate physical activity that elevates your heart rate at least 5 days out of the week.  Some examples include: Walking or jogging at a pace that allows you to carry on a conversation Cycling (stationary bike or outdoors) Water aerobics Yoga Weight lifting Dancing If physical limitations prevent you from putting stress on your joints, exercise in a pool or seated in a chair are excellent options.  Do determine your MAXIMUM heart rate for activity: YOUR AGE - 220 = MAX HeartRate   Remember! Do not push yourself too hard.  Start slowly and build up your pace, speed, weight, time in exercise, etc.  Allow your body to rest between exercise and get good sleep. You will need more water than normal when you are exerting yourself. Do not wait until you are thirsty to drink. Drink with a purpose of getting in at least 8, 8 ounce glasses of water a day plus more depending on how much you exercise and sweat.    If you begin to develop dizziness, chest pain, abdominal pain, jaw pain, shortness of breath, headache, vision changes,  lightheadedness, or other concerning symptoms, stop the activity and allow your body to rest. If your symptoms are severe, seek emergency evaluation immediately. If your symptoms are concerning, but not severe, please let us know so that we can recommend further evaluation.   ________________________________________________________________

## 2021-08-22 LAB — H. PYLORI BREATH TEST: H pylori Breath Test: NEGATIVE

## 2021-08-23 NOTE — Progress Notes (Signed)
[facility] Message sent Jeffrey Bennett,   Your H. Pylori test was negative. I would like to see how you feel on the current medication for a week or so and let me know. If no improvement then I think we need to continue with an evaluation by GI. If you do see improvement, then we can hold off and see how you are doing. How do you feel about this plan?  SaraBeth

## 2021-08-29 ENCOUNTER — Telehealth (HOSPITAL_BASED_OUTPATIENT_CLINIC_OR_DEPARTMENT_OTHER): Payer: Self-pay

## 2021-08-29 NOTE — Telephone Encounter (Signed)
Results reviewed by patient via MyChart.  Seen on 08/23/2021  1:05 PM

## 2021-08-29 NOTE — Telephone Encounter (Signed)
-----   Message from [name], NP sent at 08/23/2021  1:02 PM EDT ----- [facility] Message sent Jeffrey Bennett,   Your H. Pylori test was negative. I would like to see how you feel on the current medication for a week or so and let me know. If no improvement then I think we need to continue with an evaluation by GI. If you do see improvement, then we can hold off and see how you are doing. How do you feel about this plan?  SaraBeth

## 2021-09-18 ENCOUNTER — Ambulatory Visit (INDEPENDENT_AMBULATORY_CARE_PROVIDER_SITE_OTHER): Payer: 59 | Admitting: Nurse Practitioner

## 2021-09-18 ENCOUNTER — Other Ambulatory Visit: Payer: Self-pay

## 2021-09-18 ENCOUNTER — Encounter (HOSPITAL_BASED_OUTPATIENT_CLINIC_OR_DEPARTMENT_OTHER): Payer: Self-pay | Admitting: Nurse Practitioner

## 2021-09-18 VITALS — BP 110/72 | HR 75 | Ht 73.0 in | Wt 195.2 lb

## 2021-09-18 DIAGNOSIS — R1010 Upper abdominal pain, unspecified: Secondary | ICD-10-CM | POA: Diagnosis not present

## 2021-09-18 DIAGNOSIS — G8929 Other chronic pain: Secondary | ICD-10-CM | POA: Insufficient documentation

## 2021-09-18 DIAGNOSIS — G44211 Episodic tension-type headache, intractable: Secondary | ICD-10-CM | POA: Diagnosis not present

## 2021-09-18 DIAGNOSIS — M545 Low back pain, unspecified: Secondary | ICD-10-CM | POA: Diagnosis not present

## 2021-09-18 DIAGNOSIS — L732 Hidradenitis suppurativa: Secondary | ICD-10-CM

## 2021-09-18 HISTORY — DX: Episodic tension-type headache, intractable: G44.211

## 2021-09-18 MED ORDER — DOXYCYCLINE HYCLATE 100 MG PO TABS: ORAL_TABLET | ORAL | 6 refills | Status: DC

## 2021-09-18 MED ORDER — MELOXICAM 15 MG PO TABS: 15.0000 mg | ORAL_TABLET | Freq: Every day | ORAL | 6 refills | Status: DC

## 2021-09-18 NOTE — Patient Instructions (Signed)
I have sent in medication for your headaches and back pain that should help. This is a once a day medication that will help with pain and inflammation. Do not take ibuprofen, Advil, or motrin with this medication as this can cause an interaction, but you can take Tylenol with this if needed or the muscle relaxer.    I have given you stretches to do at home. I also recommend ice and heat to the lower back to help with the pain. This will take several weeks of doing every day to notice a big change, but it should begin to improve slowly.   I have sent in Doxycycline for you for the hidradenitis on your neck. It has refills if you should need it again.

## 2021-09-18 NOTE — Assessment & Plan Note (Signed)
Resolved with 30d of pantoprazole.  He has additional refill available if symptoms return.  He will be taking meloxicam for his back, therefore, he would likely benefit from continued treatment at this time. He will follow-up if symptoms return.

## 2021-09-18 NOTE — Assessment & Plan Note (Signed)
Well controlled with as needed doxycycline.  One small area on the anterior neck starting to appear at this time.  Recommend restart doxy for 2 weeks and monitor.  If symptoms return, he can restart the medication.  Maintenance dosing may be needed for repeated exacerbations.

## 2021-09-18 NOTE — Assessment & Plan Note (Signed)
Symptoms and presentation consistent with tension type headache related to eye muscle strain.  Recommend avoiding straining eyes until he has further evaluation with the eye doctor.  We will start once a day meloxicam to see if this is more helpful than tylenol. He also has muscle relaxers at home, that may be helpful or use at bedtime.  We discussed these options and he is aware to call back if his symptoms worsen or fail to improve.

## 2021-09-18 NOTE — Assessment & Plan Note (Signed)
Chronic bilateral low back pain without neurological symptoms. Symptoms and presentation consistent with musculoskeletal weakness resulting in spasming with prolonged standing. No radicular symptoms present at this time.  No red flags. Will avoid imaging at this time as this does appear to have been something that has been going on for a while but we can always consider that in the future if symptoms do not improve. Discussed with the patient the option of formal physical therapy versus at home exercises.  He would like to try at home exercises on his own first. Will send in meloxicam 15 mg daily that he can take for pain management.  Discussed with the patient to avoid all NSAIDs while taking this medication but he may continue to use intermittent Tylenol as needed ensuring that he does not exceed 3000 mg/day. Also recommend ice and heat to the area to help eliminate pain and any inflammation that may be present. He does have a prescription for muscle relaxers at home.  Discussed with him that he may try these at nighttime for a few days to see if this helps loosen up the back, particularly when he first starts his exercises at this may exacerbate spasming. He will try this on his own for the next few weeks and let me know if his symptoms improve or worsen. If no improvement at all by the end of the week we also can consider a steroid burst.

## 2021-09-18 NOTE — Progress Notes (Signed)
Established Patient Office Visit  Subjective:  Patient ID: Jeffrey Bennett, male    DOB: 19-Jun-1986  Age: 35 y.o. MRN: [id number]  CC:  Chief Complaint  Patient presents with   Follow-up    Hs and Abdominal pain   Abdominal Pain    Abdominal pain seems to have resolved. He states he is doing much better   Hidradenitis suppirativa    Patient reports HS flares have been better but he had a new spot appear on his neck. Seems to be resolved now but he is requesting more doxy   Headache    Patient states he is having headaches frequently that are not relieved with tylenol anymore. He is working with [facility] to get the right eye wear as he has light sensitivity and he received some things from [facility] that were incorrect and he had to return.   Back Pain    Patient is still having issues with lower back pain. He states this stemmed from an injury 2-3 years ago when he fell while working at [employer]. He sustained an ACL and MCL tear at the time of injury as well. Patient states if he doesn't feel he can stand for longer then 15 min    HPI Jeffrey Bennett presents for Follow-Up for stomach pain and hidradenitis.  He would also like to discuss headaches and low back pain.   Stomach Pain Significant improvement with pantoprazole use daily. He finished his last pill today and he reports all symptoms have resolved. He did get a refill in the event that symptoms return.   Hidradenitis He reports that the abscessed locations have all cleared with exception of one on his neck that just started. He tells me he has never had them this clear in the past. He would like to restart a cycle of doxycycline to see if he can get rid of the current abscess if that is ok. It is not currently draining, but he does report it has increased in size over the last few days and appears to be tracking under the skin.   Low back pain He endorses an injury 2-3 years ago on his knee that also left him with low  back pain. He was treated and released for the knee injury, but reports that he has continued to have low back pain on and off. He reports that his back begins to ache when he stands for more than 15 minutes at a time. He does report he mostly sits and props a pillow behind his back, which helps. He denies any weakness or paresthesias or saddle symptoms. He endorses tightness in the lower back bilaterally.   Headache He reports he was seen by the eye doctor recently and there was a mix-up in his exam and prescription. This has left him without the right corrective lenses at this time and since then he has been having headaches. He is working with the eye doctor to get a new exam and corrective lenses, but he reports that this can take up to 6 weeks for insurance to approve the new exam. During this period, he is left with vision problems that are causing the headaches and sensitivity to light. He has been taking tylenol for this, but it is not helping. He endorses headache right above the left eye that will sometimes radiate across the forehead. It is worse when lights are bright and he is straining his eyes. He has no dizziness, nausea, weakness, numbness, or balance  issues.   ROS Review of Systems All review of systems negative except what is listed in the HPI    Objective:    Physical Exam Vitals and nursing note reviewed.  Constitutional:      Appearance: He is well-developed and normal weight.  HENT:     Head: Normocephalic.  Eyes:     Extraocular Movements: Extraocular movements intact.     Pupils: Pupils are equal, round, and reactive to light.  Cardiovascular:     Rate and Rhythm: Normal rate and regular rhythm.     Heart sounds: Murmur heard.  Pulmonary:     Effort: Pulmonary effort is normal.     Breath sounds: Normal breath sounds.  Abdominal:     General: Abdomen is flat. Bowel sounds are normal. There is no distension.     Palpations: Abdomen is soft.     Tenderness: There  is no abdominal tenderness.  Musculoskeletal:       Arms:     Comments: Bilateral lumbar tenderness and tightness noted with palpation. No changes to skin.   Skin:    General: Skin is warm and dry.     Capillary Refill: Capillary refill takes less than 2 seconds.          Comments: Approximate 1 cm area of inflammation noted on the anterior neck consistent with hydradenitis suppurativa. No drainage noted at this time.  No signs of cellulitis, erythema, or warmth present.  Neurological:     General: No focal deficit present.     Mental Status: He is alert and oriented to person, place, and time.     Cranial Nerves: No cranial nerve deficit.     Motor: No weakness.  Psychiatric:        Mood and Affect: Mood normal.        Behavior: Behavior normal.    BP 110/72   Pulse 75   Ht 6\' 1"  (1.854 m)   Wt 195 lb 3.2 oz (88.5 kg)   SpO2 99%   BMI 25.75 kg/m  Wt Readings from Last 3 Encounters:  09/18/21 195 lb 3.2 oz (88.5 kg)  08/21/21 193 lb 9.6 oz (87.8 kg)  10/31/19 190 lb (86.2 kg)    Assessment & Plan:   Problem List Items Addressed This Visit     Hidradenitis suppurativa    Well controlled with as needed doxycycline.  One small area on the anterior neck starting to appear at this time.  Recommend restart doxy for 2 weeks and monitor.  If symptoms return, he can restart the medication.  Maintenance dosing may be needed for repeated exacerbations.       Relevant Medications   doxycycline (VIBRA-TABS) 100 MG tablet   Pain of upper abdomen    Resolved with 30d of pantoprazole.  He has additional refill available if symptoms return.  He will be taking meloxicam for his back, therefore, he would likely benefit from continued treatment at this time. He will follow-up if symptoms return.       Intractable episodic tension-type headache    Symptoms and presentation consistent with tension type headache related to eye muscle strain.  Recommend avoiding straining eyes until  he has further evaluation with the eye doctor.  We will start once a day meloxicam to see if this is more helpful than tylenol. He also has muscle relaxers at home, that may be helpful or use at bedtime.  We discussed these options and he is aware to call back if  his symptoms worsen or fail to improve.       Relevant Medications   meloxicam (MOBIC) 15 MG tablet   Chronic bilateral low back pain without sciatica - Primary    Chronic bilateral low back pain without neurological symptoms. Symptoms and presentation consistent with musculoskeletal weakness resulting in spasming with prolonged standing. No radicular symptoms present at this time.  No red flags. Will avoid imaging at this time as this does appear to have been something that has been going on for a while but we can always consider that in the future if symptoms do not improve. Discussed with the patient the option of formal physical therapy versus at home exercises.  He would like to try at home exercises on his own first. Will send in meloxicam 15 mg daily that he can take for pain management.  Discussed with the patient to avoid all NSAIDs while taking this medication but he may continue to use intermittent Tylenol as needed ensuring that he does not exceed 3000 mg/day. Also recommend ice and heat to the area to help eliminate pain and any inflammation that may be present. He does have a prescription for muscle relaxers at home.  Discussed with him that he may try these at nighttime for a few days to see if this helps loosen up the back, particularly when he first starts his exercises at this may exacerbate spasming. He will try this on his own for the next few weeks and let me know if his symptoms improve or worsen. If no improvement at all by the end of the week we also can consider a steroid burst.      Relevant Medications   meloxicam (MOBIC) 15 MG tablet    Meds ordered this encounter  Medications   doxycycline (VIBRA-TABS)  100 MG tablet    Sig: Take one tab every 12 hours (twice a day) for 14 days then one tablet once a day from then on. If symptoms return after dropping back to once a day, repeat the twice a day cycle for 14 days.    Dispense:  42 tablet    Refill:  6   meloxicam (MOBIC) 15 MG tablet    Sig: Take 1 tablet (15 mg total) by mouth daily.    Dispense:  30 tablet    Refill:  6    Follow-up: Return if symptoms worsen or fail to improve.    [name], NP

## 2021-11-15 ENCOUNTER — Ambulatory Visit (INDEPENDENT_AMBULATORY_CARE_PROVIDER_SITE_OTHER): Payer: 59 | Admitting: Nurse Practitioner

## 2021-11-15 ENCOUNTER — Other Ambulatory Visit (HOSPITAL_BASED_OUTPATIENT_CLINIC_OR_DEPARTMENT_OTHER): Payer: Self-pay | Admitting: Nurse Practitioner

## 2021-11-15 ENCOUNTER — Other Ambulatory Visit: Payer: Self-pay

## 2021-11-15 ENCOUNTER — Ambulatory Visit
Admission: RE | Admit: 2021-11-15 | Discharge: 2021-11-15 | Disposition: A | Discharge status: Home / Self Care | Payer: 59 | Source: Ambulatory Visit | Attending: Nurse Practitioner | Admitting: Nurse Practitioner

## 2021-11-15 ENCOUNTER — Encounter (HOSPITAL_BASED_OUTPATIENT_CLINIC_OR_DEPARTMENT_OTHER): Payer: Self-pay | Admitting: Nurse Practitioner

## 2021-11-15 DIAGNOSIS — M545 Low back pain, unspecified: Secondary | ICD-10-CM | POA: Diagnosis not present

## 2021-11-15 DIAGNOSIS — G8929 Other chronic pain: Secondary | ICD-10-CM

## 2021-11-15 DIAGNOSIS — M79604 Pain in right leg: Secondary | ICD-10-CM

## 2021-11-15 HISTORY — DX: Pain in right leg: M79.604

## 2021-11-15 MED ORDER — CYCLOBENZAPRINE HCL 10 MG PO TABS: 10.0000 mg | ORAL_TABLET | Freq: Three times a day (TID) | ORAL | 3 refills | Status: DC | PRN

## 2021-11-15 MED ORDER — PREDNISONE 50 MG PO TABS: 50.0000 mg | ORAL_TABLET | Freq: Every day | ORAL | 0 refills | Status: DC

## 2021-11-15 MED ORDER — MELOXICAM 15 MG PO TABS: 15.0000 mg | ORAL_TABLET | Freq: Every day | ORAL | 6 refills | Status: DC

## 2021-11-15 MED ORDER — OXYCODONE-ACETAMINOPHEN 5-325 MG PO TABS: 1.0000 | ORAL_TABLET | Freq: Four times a day (QID) | ORAL | 0 refills | Status: DC | PRN

## 2021-11-15 NOTE — Patient Instructions (Signed)
I want you to have x-rays of your neck and back. These are done at [facility].  Located inside [facility] Address: [address], [place], [place] [address] Hours: 8am -5:30pm Monday through Friday You can walk in to have these done- no appointment needed  I want you to use ice to your neck, back, chest, and shoulder for 20 minutes at a time 3 times a day. You may also use heat to help with pain.   I have sent in prednisone 50mg  once a day for 5 days THEN start the meloxicam daily after that.   I have sent in flexeril for muscle spasms.   I have sent in percocet for pain- try to take this as little as possible, but don't let the pain get unbearable before taking.   Take it easy through the weekend and start gentle stretches next week, unless I call you with concerns about the x-ray.  Let me know if this gets any worse.
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
## 2021-11-21 ENCOUNTER — Encounter (HOSPITAL_BASED_OUTPATIENT_CLINIC_OR_DEPARTMENT_OTHER): Payer: Self-pay | Admitting: Nurse Practitioner

## 2021-11-22 ENCOUNTER — Ambulatory Visit (INDEPENDENT_AMBULATORY_CARE_PROVIDER_SITE_OTHER): Payer: 59 | Admitting: Family Medicine

## 2021-11-22 ENCOUNTER — Encounter (HOSPITAL_BASED_OUTPATIENT_CLINIC_OR_DEPARTMENT_OTHER): Payer: Self-pay | Admitting: Family Medicine

## 2021-11-22 ENCOUNTER — Other Ambulatory Visit: Payer: Self-pay

## 2021-11-22 DIAGNOSIS — M549 Dorsalgia, unspecified: Secondary | ICD-10-CM | POA: Insufficient documentation

## 2021-11-22 DIAGNOSIS — M25511 Pain in right shoulder: Secondary | ICD-10-CM | POA: Insufficient documentation

## 2021-11-22 HISTORY — DX: Dorsalgia, unspecified: M54.9
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
## 2021-11-22 NOTE — Patient Instructions (Addendum)
°  Medication Instructions:  Your physician recommends that you continue on your current medications as directed. Please refer to the Current Medication list given to you today. --If you need a refill on any your medications before your next appointment, please call your pharmacy first. If no refills are authorized on file call the office.-- Referrals/Procedures/Imaging: A referral has been placed for you to go to Outpatient Physical Therapy at [facility]. Someone from the scheduling department will be in contact with you in regards to coordinating your consultation. If you do not hear from any of the schedulers within 7-10 business days please give our office a call.  [facility] on the 1st Floor Hours of Operation: Monday - Friday 8:00 am - 5:00 pm Closed on weekends and all major holidays (P) [phone]   Follow-Up: Your next appointment:   Your physician recommends that you schedule a follow-up appointment in: 4 WEEKS with Shawna Clamp, DNP  You will receive a text message or e-mail with a link to a survey about your care and experience with [place] today! We would greatly appreciate your feedback!   Thanks for letting us be apart of your health journey!!  Primary Care and Sports Medicine   Dr. Ceasar Mons [place]   We encourage you to activate your patient portal called "MyChart".  Sign up information is provided on this After Visit Summary.  MyChart is used to connect with patients for Virtual Visits (Telemedicine).  Patients are able to view lab/test results, encounter notes, upcoming appointments, etc.  Non-urgent messages can be sent to your provider as well. To learn more about what you can do with MyChart, please visit --  [email or web address].

## 2021-11-22 NOTE — Assessment & Plan Note (Signed)
HPI as above, management as outlined above Will proceed with physical therapy evaluation for back and shoulder
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
## 2021-11-23 ENCOUNTER — Encounter (HOSPITAL_BASED_OUTPATIENT_CLINIC_OR_DEPARTMENT_OTHER): Payer: Self-pay | Admitting: Family Medicine

## 2021-11-25 ENCOUNTER — Encounter (HOSPITAL_BASED_OUTPATIENT_CLINIC_OR_DEPARTMENT_OTHER): Payer: Self-pay | Admitting: Nurse Practitioner

## 2021-11-26 ENCOUNTER — Encounter (HOSPITAL_BASED_OUTPATIENT_CLINIC_OR_DEPARTMENT_OTHER): Payer: Self-pay

## 2021-11-30 ENCOUNTER — Ambulatory Visit (HOSPITAL_BASED_OUTPATIENT_CLINIC_OR_DEPARTMENT_OTHER): Payer: 59 | Admitting: Physical Therapy

## 2021-12-17 ENCOUNTER — Ambulatory Visit (HOSPITAL_BASED_OUTPATIENT_CLINIC_OR_DEPARTMENT_OTHER): Payer: 59 | Attending: Family Medicine | Admitting: Physical Therapy

## 2021-12-17 ENCOUNTER — Other Ambulatory Visit: Payer: Self-pay

## 2021-12-17 ENCOUNTER — Encounter (HOSPITAL_BASED_OUTPATIENT_CLINIC_OR_DEPARTMENT_OTHER): Payer: Self-pay | Admitting: Physical Therapy

## 2021-12-17 DIAGNOSIS — M6281 Muscle weakness (generalized): Secondary | ICD-10-CM | POA: Insufficient documentation

## 2021-12-17 DIAGNOSIS — R262 Difficulty in walking, not elsewhere classified: Secondary | ICD-10-CM | POA: Diagnosis not present

## 2021-12-17 DIAGNOSIS — M25511 Pain in right shoulder: Secondary | ICD-10-CM | POA: Insufficient documentation

## 2021-12-17 DIAGNOSIS — M545 Low back pain, unspecified: Secondary | ICD-10-CM | POA: Diagnosis present

## 2021-12-17 DIAGNOSIS — M25611 Stiffness of right shoulder, not elsewhere classified: Secondary | ICD-10-CM | POA: Diagnosis not present
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
## 2021-12-19 ENCOUNTER — Other Ambulatory Visit: Payer: Self-pay

## 2021-12-19 ENCOUNTER — Other Ambulatory Visit (HOSPITAL_BASED_OUTPATIENT_CLINIC_OR_DEPARTMENT_OTHER): Payer: Self-pay

## 2021-12-19 ENCOUNTER — Ambulatory Visit (INDEPENDENT_AMBULATORY_CARE_PROVIDER_SITE_OTHER): Payer: 59 | Admitting: Nurse Practitioner

## 2021-12-19 ENCOUNTER — Encounter (HOSPITAL_BASED_OUTPATIENT_CLINIC_OR_DEPARTMENT_OTHER): Payer: Self-pay | Admitting: Nurse Practitioner

## 2021-12-19 DIAGNOSIS — M549 Dorsalgia, unspecified: Secondary | ICD-10-CM | POA: Diagnosis not present

## 2021-12-19 DIAGNOSIS — M25562 Pain in left knee: Secondary | ICD-10-CM | POA: Diagnosis not present

## 2021-12-19 DIAGNOSIS — G8929 Other chronic pain: Secondary | ICD-10-CM

## 2021-12-19 DIAGNOSIS — R1010 Upper abdominal pain, unspecified: Secondary | ICD-10-CM | POA: Diagnosis not present

## 2021-12-19 DIAGNOSIS — M25511 Pain in right shoulder: Secondary | ICD-10-CM | POA: Diagnosis not present

## 2021-12-19 MED ORDER — DEXAMETHASONE 4 MG PO TABS
ORAL_TABLET | ORAL | 0 refills | Status: DC
  Filled 2021-12-19: qty 3, 6d supply, fill #0
  Filled 2021-12-19 (×2): qty 25, 21d supply, fill #0

## 2021-12-19 MED ORDER — HYDROCODONE-ACETAMINOPHEN 5-325 MG PO TABS
1.0000 | ORAL_TABLET | ORAL | 0 refills | Status: AC | PRN
  Filled 2021-12-19: qty 30, 5d supply, fill #0

## 2021-12-19 MED ORDER — AMBULATORY NON FORMULARY MEDICATION: 0 refills | Status: AC

## 2021-12-19 MED ORDER — PANTOPRAZOLE SODIUM 40 MG PO TBEC
40.0000 mg | DELAYED_RELEASE_TABLET | Freq: Every day | ORAL | 3 refills | Status: DC
  Filled 2021-12-19: qty 30, 30d supply, fill #0

## 2021-12-19 MED ORDER — DEXAMETHASONE 1 MG PO TABS
ORAL_TABLET | ORAL | 0 refills | Status: DC
  Filled 2021-12-19: qty 6, 6d supply, fill #0
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
## 2021-12-19 NOTE — Patient Instructions (Signed)
I will send the order for the TENS unit for your muscles. They should contact you to let you know if there will be any cost.  I sent in more pain medication for you and a steroid taper that will be taken for several weeks to help get the inflammation down.   If this is not getting better with PT over the next 4 weeks with regular treatment, please let me know.

## 2021-12-20 ENCOUNTER — Encounter (HOSPITAL_BASED_OUTPATIENT_CLINIC_OR_DEPARTMENT_OTHER): Payer: Self-pay | Admitting: Physical Therapy

## 2021-12-20 ENCOUNTER — Ambulatory Visit (HOSPITAL_BASED_OUTPATIENT_CLINIC_OR_DEPARTMENT_OTHER): Payer: 59 | Attending: Family Medicine | Admitting: Physical Therapy

## 2021-12-20 DIAGNOSIS — R262 Difficulty in walking, not elsewhere classified: Secondary | ICD-10-CM | POA: Diagnosis present

## 2021-12-20 DIAGNOSIS — M25611 Stiffness of right shoulder, not elsewhere classified: Secondary | ICD-10-CM | POA: Insufficient documentation

## 2021-12-20 DIAGNOSIS — M6281 Muscle weakness (generalized): Secondary | ICD-10-CM | POA: Diagnosis present

## 2021-12-20 DIAGNOSIS — M545 Low back pain, unspecified: Secondary | ICD-10-CM | POA: Diagnosis not present
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
## 2021-12-21 NOTE — Assessment & Plan Note (Signed)
Cervical and thoracic back spasms noted on evaluation today.  Range of motion is decreased however strength appears to be intact with only minor weakness due to pain.  Negative empty can. X-rays have been performed and reviewed with no bony abnormalities noted. There is mild tenderness over the left shoulder at the area of the proximal biceps tendon as well as pectoral muscle and trapezius.  Definite spasming is present. Recommendation at this time is to continue with PT on a routine basis and continue home PT exercises. We will prescribe refill on steroid to help decrease inflammation and Flexeril to help reduce spasming.  Given the significance of his pain we will also prescribe a one-time refill on oral pain medication to help while he begins with his physical therapy. Discussed with patient that resolution of symptoms may take several weeks with continued formal physical therapy.  Recommend slowly increasing weight capacity with lifting and avoid excessive movement to prevent exacerbation or worsening of symptoms. He will follow-up if symptoms worsen or fail to improve.

## 2021-12-21 NOTE — Assessment & Plan Note (Signed)
Refill on Protonix today for suspected GERD.  Good control with this medication.  No alarm symptoms present.  No changes to plan of care today-we will plan to continue medication on a routine basis especially while patient is taking medications that can be progressive to the GI system.  Patient will follow-up if new symptoms or worsening symptoms arise.
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
## 2021-12-21 NOTE — Assessment & Plan Note (Signed)
Plan to continue with physical therapy at this time. If symptoms are not improved with physical therapy over the next 4 weeks will consider referral for orthopedic evaluation as possible further imaging may be necessary.  Empty can negative today which is reassuring. Recommend follow-up if symptoms worsen or fail to improve.

## 2021-12-24 ENCOUNTER — Ambulatory Visit (HOSPITAL_BASED_OUTPATIENT_CLINIC_OR_DEPARTMENT_OTHER): Payer: 59 | Admitting: Physical Therapy

## 2021-12-24 ENCOUNTER — Other Ambulatory Visit: Payer: Self-pay

## 2021-12-24 ENCOUNTER — Encounter (HOSPITAL_BASED_OUTPATIENT_CLINIC_OR_DEPARTMENT_OTHER): Payer: Self-pay | Admitting: Physical Therapy

## 2021-12-24 DIAGNOSIS — M6281 Muscle weakness (generalized): Secondary | ICD-10-CM

## 2021-12-24 DIAGNOSIS — R262 Difficulty in walking, not elsewhere classified: Secondary | ICD-10-CM

## 2021-12-24 DIAGNOSIS — M25611 Stiffness of right shoulder, not elsewhere classified: Secondary | ICD-10-CM

## 2021-12-24 DIAGNOSIS — M545 Low back pain, unspecified: Secondary | ICD-10-CM | POA: Diagnosis not present
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
## 2021-12-25 ENCOUNTER — Telehealth (HOSPITAL_BASED_OUTPATIENT_CLINIC_OR_DEPARTMENT_OTHER): Payer: Self-pay

## 2021-12-25 NOTE — Telephone Encounter (Signed)
Faxed order over to [employer] for tens unit

## 2021-12-27 ENCOUNTER — Ambulatory Visit (HOSPITAL_BASED_OUTPATIENT_CLINIC_OR_DEPARTMENT_OTHER): Payer: 59 | Admitting: Physical Therapy

## 2021-12-27 ENCOUNTER — Encounter (HOSPITAL_BASED_OUTPATIENT_CLINIC_OR_DEPARTMENT_OTHER): Payer: Self-pay | Admitting: Physical Therapy

## 2021-12-27 ENCOUNTER — Other Ambulatory Visit: Payer: Self-pay

## 2021-12-27 DIAGNOSIS — M545 Low back pain, unspecified: Secondary | ICD-10-CM | POA: Diagnosis not present

## 2021-12-27 DIAGNOSIS — M6281 Muscle weakness (generalized): Secondary | ICD-10-CM

## 2021-12-27 DIAGNOSIS — R262 Difficulty in walking, not elsewhere classified: Secondary | ICD-10-CM

## 2021-12-27 DIAGNOSIS — M25611 Stiffness of right shoulder, not elsewhere classified: Secondary | ICD-10-CM
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
## 2021-12-28 ENCOUNTER — Encounter (HOSPITAL_BASED_OUTPATIENT_CLINIC_OR_DEPARTMENT_OTHER): Payer: Self-pay | Admitting: Nurse Practitioner

## 2021-12-31 ENCOUNTER — Encounter (HOSPITAL_BASED_OUTPATIENT_CLINIC_OR_DEPARTMENT_OTHER): Payer: Self-pay | Admitting: Physical Therapy

## 2021-12-31 ENCOUNTER — Telehealth (HOSPITAL_BASED_OUTPATIENT_CLINIC_OR_DEPARTMENT_OTHER): Payer: Self-pay | Admitting: Nurse Practitioner

## 2021-12-31 NOTE — Telephone Encounter (Signed)
Received a fax from [employer] for ROI. Sent 3rd party release to the ROI team on 2/13 and received confirmation.

## 2022-01-01 ENCOUNTER — Other Ambulatory Visit (HOSPITAL_BASED_OUTPATIENT_CLINIC_OR_DEPARTMENT_OTHER): Payer: Self-pay | Admitting: Nurse Practitioner

## 2022-01-01 MED ORDER — OXYCODONE-ACETAMINOPHEN 5-325 MG PO TABS: 1.0000 | ORAL_TABLET | Freq: Four times a day (QID) | ORAL | 0 refills | Status: DC | PRN

## 2022-01-03 ENCOUNTER — Ambulatory Visit (HOSPITAL_BASED_OUTPATIENT_CLINIC_OR_DEPARTMENT_OTHER): Payer: 59 | Admitting: Physical Therapy

## 2022-01-03 ENCOUNTER — Other Ambulatory Visit (HOSPITAL_BASED_OUTPATIENT_CLINIC_OR_DEPARTMENT_OTHER): Payer: Self-pay

## 2022-01-03 ENCOUNTER — Ambulatory Visit (INDEPENDENT_AMBULATORY_CARE_PROVIDER_SITE_OTHER): Payer: 59 | Admitting: Nurse Practitioner

## 2022-01-03 ENCOUNTER — Other Ambulatory Visit: Payer: Self-pay

## 2022-01-03 ENCOUNTER — Encounter (HOSPITAL_BASED_OUTPATIENT_CLINIC_OR_DEPARTMENT_OTHER): Payer: Self-pay | Admitting: Nurse Practitioner

## 2022-01-03 ENCOUNTER — Encounter (HOSPITAL_BASED_OUTPATIENT_CLINIC_OR_DEPARTMENT_OTHER): Payer: Self-pay | Admitting: Physical Therapy

## 2022-01-03 VITALS — BP 122/82 | HR 81 | Ht 73.0 in | Wt 202.9 lb

## 2022-01-03 DIAGNOSIS — R262 Difficulty in walking, not elsewhere classified: Secondary | ICD-10-CM

## 2022-01-03 DIAGNOSIS — M25611 Stiffness of right shoulder, not elsewhere classified: Secondary | ICD-10-CM

## 2022-01-03 DIAGNOSIS — M6281 Muscle weakness (generalized): Secondary | ICD-10-CM

## 2022-01-03 DIAGNOSIS — M545 Low back pain, unspecified: Secondary | ICD-10-CM | POA: Diagnosis not present

## 2022-01-03 DIAGNOSIS — K642 Third degree hemorrhoids: Secondary | ICD-10-CM

## 2022-01-03 MED ORDER — OXYCODONE-ACETAMINOPHEN 5-325 MG PO TABS
1.0000 | ORAL_TABLET | Freq: Four times a day (QID) | ORAL | 0 refills | Status: AC | PRN
  Filled 2022-01-03: qty 30, 4d supply, fill #0

## 2022-01-03 MED ORDER — HEMORRHOIDAL 0.25-14-74.9 % RE OINT
1.0000 "application " | TOPICAL_OINTMENT | Freq: Two times a day (BID) | RECTAL | 1 refills | Status: AC | PRN
  Filled 2022-01-03: qty 56, 7d supply, fill #0
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
## 2022-01-03 NOTE — Patient Instructions (Addendum)
You can take imodium for the loose stools. Take one with your next bowel movement and this should help slow this down.   I want you to apply desitin (diaper ointment) to the rectum in the morning, at bedtime, and after each bowel movement. This will help protect the delicate tissue in that area.   I have sent in hemorrhoid cream to the pharmacy for you to use twice a day. This will help shrink the hemorrhoid and give you relief.  You may use over the counter hemorrhoid "cooling" gel that can help with the pain and Witch hazel on a piece of toilet paper or baby wipe to wipe to help soothe the area (they also sell these in a pre-made wipe, but it is cheaper to buy the bottle and use a baby wipe or similar).  Rinse the bottom with warm water after each bowel movement.   You may see small amounts of blood for the next day or so, but if this gets worse or you see very large amounts of blood or blood that is present when you are not having a bowel movement, I want you to let me know.

## 2022-01-04 DIAGNOSIS — K642 Third degree hemorrhoids: Secondary | ICD-10-CM | POA: Insufficient documentation

## 2022-01-04 NOTE — Assessment & Plan Note (Signed)
Symptoms and presentation consistent with prolapsed hemorrhoid.  Most likely due to chronic and frequent bowel movements.  Suspect possible GI viral etiology causing increased loose stools.  No tenderness or mass noted to abdominal area today.  Vital signs are stable.  No evidence of significant blood loss. Discussed with patient management of symptoms with external and internal hemorrhoid cream, utilization of cold compress, utilization of Desitin cream, and option to use witch hazel on moistened baby wipe while wiping after bowel movement to help reduce irritation. Discussed with patient that he may see small amounts of bright red blood on the toilet tissue with subsequent bowel movements in the coming days however this should not be large amounts of blood.  Instructed patient to contact the office if symptoms worsen or fail to improve or if new symptoms develop.

## 2022-01-07 ENCOUNTER — Ambulatory Visit (HOSPITAL_BASED_OUTPATIENT_CLINIC_OR_DEPARTMENT_OTHER): Payer: 59 | Admitting: Physical Therapy

## 2022-01-07 ENCOUNTER — Encounter (HOSPITAL_BASED_OUTPATIENT_CLINIC_OR_DEPARTMENT_OTHER): Payer: Self-pay | Admitting: Physical Therapy

## 2022-01-07 ENCOUNTER — Other Ambulatory Visit: Payer: Self-pay

## 2022-01-07 DIAGNOSIS — R262 Difficulty in walking, not elsewhere classified: Secondary | ICD-10-CM

## 2022-01-07 DIAGNOSIS — M6281 Muscle weakness (generalized): Secondary | ICD-10-CM

## 2022-01-07 DIAGNOSIS — M545 Low back pain, unspecified: Secondary | ICD-10-CM

## 2022-01-07 DIAGNOSIS — M25611 Stiffness of right shoulder, not elsewhere classified: Secondary | ICD-10-CM
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
## 2022-01-10 ENCOUNTER — Other Ambulatory Visit: Payer: Self-pay

## 2022-01-10 ENCOUNTER — Encounter (HOSPITAL_BASED_OUTPATIENT_CLINIC_OR_DEPARTMENT_OTHER): Payer: Self-pay | Admitting: Physical Therapy

## 2022-01-10 ENCOUNTER — Ambulatory Visit (HOSPITAL_BASED_OUTPATIENT_CLINIC_OR_DEPARTMENT_OTHER): Payer: 59 | Admitting: Physical Therapy

## 2022-01-10 DIAGNOSIS — M545 Low back pain, unspecified: Secondary | ICD-10-CM | POA: Diagnosis not present

## 2022-01-10 DIAGNOSIS — M25611 Stiffness of right shoulder, not elsewhere classified: Secondary | ICD-10-CM

## 2022-01-10 DIAGNOSIS — M6281 Muscle weakness (generalized): Secondary | ICD-10-CM

## 2022-01-10 DIAGNOSIS — R262 Difficulty in walking, not elsewhere classified: Secondary | ICD-10-CM
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
## 2022-01-11 ENCOUNTER — Other Ambulatory Visit (HOSPITAL_BASED_OUTPATIENT_CLINIC_OR_DEPARTMENT_OTHER): Payer: Self-pay | Admitting: Nurse Practitioner

## 2022-01-11 DIAGNOSIS — R1010 Upper abdominal pain, unspecified: Secondary | ICD-10-CM

## 2022-01-14 ENCOUNTER — Encounter (HOSPITAL_BASED_OUTPATIENT_CLINIC_OR_DEPARTMENT_OTHER): Payer: Self-pay

## 2022-01-14 ENCOUNTER — Ambulatory Visit (HOSPITAL_BASED_OUTPATIENT_CLINIC_OR_DEPARTMENT_OTHER): Payer: 59 | Admitting: Physical Therapy

## 2022-01-17 ENCOUNTER — Other Ambulatory Visit: Payer: Self-pay

## 2022-01-17 ENCOUNTER — Ambulatory Visit (HOSPITAL_BASED_OUTPATIENT_CLINIC_OR_DEPARTMENT_OTHER): Payer: 59 | Admitting: Physical Therapy

## 2022-01-17 ENCOUNTER — Encounter (HOSPITAL_BASED_OUTPATIENT_CLINIC_OR_DEPARTMENT_OTHER): Payer: Self-pay | Admitting: Nurse Practitioner

## 2022-01-17 ENCOUNTER — Encounter (HOSPITAL_BASED_OUTPATIENT_CLINIC_OR_DEPARTMENT_OTHER): Payer: Self-pay | Admitting: Physical Therapy

## 2022-01-17 ENCOUNTER — Encounter (HOSPITAL_BASED_OUTPATIENT_CLINIC_OR_DEPARTMENT_OTHER): Payer: Self-pay

## 2022-01-17 DIAGNOSIS — M6281 Muscle weakness (generalized): Secondary | ICD-10-CM

## 2022-01-17 DIAGNOSIS — M545 Low back pain, unspecified: Secondary | ICD-10-CM

## 2022-01-17 DIAGNOSIS — R262 Difficulty in walking, not elsewhere classified: Secondary | ICD-10-CM | POA: Insufficient documentation

## 2022-01-17 DIAGNOSIS — M25611 Stiffness of right shoulder, not elsewhere classified: Secondary | ICD-10-CM | POA: Insufficient documentation
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
## 2022-01-17 NOTE — Telephone Encounter (Signed)
LVM for pt to cb to schedule this appt. ?

## 2022-01-21 ENCOUNTER — Encounter (HOSPITAL_BASED_OUTPATIENT_CLINIC_OR_DEPARTMENT_OTHER): Payer: Self-pay | Admitting: Physical Therapy

## 2022-01-21 ENCOUNTER — Ambulatory Visit (HOSPITAL_BASED_OUTPATIENT_CLINIC_OR_DEPARTMENT_OTHER): Payer: 59 | Attending: Family Medicine | Admitting: Physical Therapy

## 2022-01-21 ENCOUNTER — Other Ambulatory Visit: Payer: Self-pay

## 2022-01-21 DIAGNOSIS — M6281 Muscle weakness (generalized): Secondary | ICD-10-CM | POA: Diagnosis present

## 2022-01-21 DIAGNOSIS — R262 Difficulty in walking, not elsewhere classified: Secondary | ICD-10-CM

## 2022-01-21 DIAGNOSIS — M545 Low back pain, unspecified: Secondary | ICD-10-CM

## 2022-01-21 DIAGNOSIS — M25611 Stiffness of right shoulder, not elsewhere classified: Secondary | ICD-10-CM | POA: Diagnosis present
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
## 2022-01-22 ENCOUNTER — Encounter (HOSPITAL_BASED_OUTPATIENT_CLINIC_OR_DEPARTMENT_OTHER): Payer: 59 | Admitting: Nurse Practitioner

## 2022-01-24 ENCOUNTER — Encounter (HOSPITAL_BASED_OUTPATIENT_CLINIC_OR_DEPARTMENT_OTHER): Payer: Self-pay

## 2022-01-24 ENCOUNTER — Encounter (HOSPITAL_BASED_OUTPATIENT_CLINIC_OR_DEPARTMENT_OTHER): Payer: Self-pay | Admitting: Nurse Practitioner

## 2022-01-24 ENCOUNTER — Ambulatory Visit (INDEPENDENT_AMBULATORY_CARE_PROVIDER_SITE_OTHER): Payer: 59 | Admitting: Nurse Practitioner

## 2022-01-24 ENCOUNTER — Other Ambulatory Visit: Payer: Self-pay

## 2022-01-24 ENCOUNTER — Other Ambulatory Visit (HOSPITAL_BASED_OUTPATIENT_CLINIC_OR_DEPARTMENT_OTHER): Payer: Self-pay

## 2022-01-24 VITALS — BP 122/82 | HR 81 | Ht 73.0 in | Wt 203.0 lb

## 2022-01-24 DIAGNOSIS — L732 Hidradenitis suppurativa: Secondary | ICD-10-CM

## 2022-01-24 DIAGNOSIS — B360 Pityriasis versicolor: Secondary | ICD-10-CM

## 2022-01-24 DIAGNOSIS — L03011 Cellulitis of right finger: Secondary | ICD-10-CM | POA: Diagnosis not present

## 2022-01-24 MED ORDER — DOXYCYCLINE HYCLATE 100 MG PO TABS
100.0000 mg | ORAL_TABLET | Freq: Two times a day (BID) | ORAL | 0 refills | Status: DC
Start: 2022-01-24 — End: 2022-03-15
  Filled 2022-01-24: qty 20, 10d supply, fill #0

## 2022-01-24 MED ORDER — KETOCONAZOLE 2 % EX SHAM
MEDICATED_SHAMPOO | CUTANEOUS | 3 refills | Status: AC
Start: 2022-01-24 — End: ?
  Filled 2022-01-24: qty 120, 7d supply, fill #0
  Filled 2022-05-10: qty 120, 14d supply, fill #1
  Filled 2022-05-14: qty 120, 7d supply, fill #1
  Filled 2022-07-29 – 2022-08-30 (×3): qty 120, 7d supply, fill #2

## 2022-01-24 NOTE — Patient Instructions (Signed)
I have sent in Doxycycline for the infection. Be sure to take all of this.  ? ?I have sent in Ketoconazole shampoo for the rash.  ? ?Let me know if either one doesn't get better.  ?
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
## 2022-01-28 ENCOUNTER — Encounter (HOSPITAL_BASED_OUTPATIENT_CLINIC_OR_DEPARTMENT_OTHER): Payer: Self-pay | Admitting: Nurse Practitioner

## 2022-01-28 DIAGNOSIS — L03011 Cellulitis of right finger: Secondary | ICD-10-CM | POA: Insufficient documentation

## 2022-01-28 DIAGNOSIS — B360 Pityriasis versicolor: Secondary | ICD-10-CM | POA: Insufficient documentation

## 2022-01-28 HISTORY — DX: Pityriasis versicolor: B36.0

## 2022-01-28 HISTORY — DX: Cellulitis of right finger: L03.011

## 2022-01-28 NOTE — Assessment & Plan Note (Signed)
Attempt at incision and drainage today unsuccessful due to inability to obtain pain control. ?Will send treatment with doxycycline for infection and recommend monitoring for new or worsening symptoms.  ?Soak hand in warm salt water to facilitate drainage and aid in infection control.  ?Avoid biting nails to prevent recurrence.  ?

## 2022-01-28 NOTE — Assessment & Plan Note (Signed)
Repeat incidence of hidradenitis- new abscess on groin with healing areas on buttocks.  ?Will send doxycycline treatment and topical mupirocin. Information on hidradenitis provided. Patient may need chronic therapy to help prevent recurrence. Consider dermatology referral if difficult to treat.  ?

## 2022-01-28 NOTE — Assessment & Plan Note (Signed)
Diffuse patches of lighter discoloration present to the thoracic and chest region with coalescence throughout consistent with tinea versicolor.  ?Suspect increased heat and moisture from back brace contributing to new symptoms.  ?Recommend ketoconazole shampoo to wash area and cream for application in between showering. Recommend washing back brace to prevent recurrence of infection and avoidance of wearing moist back brace if possible.  ?F/U if sx worsen or fail to improve.  ?

## 2022-02-04 ENCOUNTER — Ambulatory Visit (HOSPITAL_BASED_OUTPATIENT_CLINIC_OR_DEPARTMENT_OTHER): Payer: 59 | Admitting: Physical Therapy

## 2022-02-04 ENCOUNTER — Encounter (HOSPITAL_BASED_OUTPATIENT_CLINIC_OR_DEPARTMENT_OTHER): Payer: Self-pay | Admitting: Physical Therapy

## 2022-02-04 ENCOUNTER — Other Ambulatory Visit: Payer: Self-pay

## 2022-02-04 DIAGNOSIS — M6281 Muscle weakness (generalized): Secondary | ICD-10-CM

## 2022-02-04 DIAGNOSIS — M545 Low back pain, unspecified: Secondary | ICD-10-CM

## 2022-02-04 DIAGNOSIS — R262 Difficulty in walking, not elsewhere classified: Secondary | ICD-10-CM

## 2022-02-04 DIAGNOSIS — M25611 Stiffness of right shoulder, not elsewhere classified: Secondary | ICD-10-CM | POA: Diagnosis not present
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
## 2022-02-08 ENCOUNTER — Ambulatory Visit (HOSPITAL_BASED_OUTPATIENT_CLINIC_OR_DEPARTMENT_OTHER): Payer: 59 | Admitting: Physical Therapy

## 2022-02-08 ENCOUNTER — Encounter (HOSPITAL_BASED_OUTPATIENT_CLINIC_OR_DEPARTMENT_OTHER): Payer: Self-pay

## 2022-02-11 ENCOUNTER — Ambulatory Visit (HOSPITAL_BASED_OUTPATIENT_CLINIC_OR_DEPARTMENT_OTHER): Payer: 59 | Admitting: Physical Therapy

## 2022-02-14 ENCOUNTER — Ambulatory Visit (HOSPITAL_BASED_OUTPATIENT_CLINIC_OR_DEPARTMENT_OTHER): Payer: 59 | Admitting: Physical Therapy

## 2022-02-14 ENCOUNTER — Encounter (HOSPITAL_BASED_OUTPATIENT_CLINIC_OR_DEPARTMENT_OTHER): Payer: Self-pay

## 2022-02-18 ENCOUNTER — Encounter (HOSPITAL_BASED_OUTPATIENT_CLINIC_OR_DEPARTMENT_OTHER): Payer: Self-pay | Admitting: Physical Therapy

## 2022-02-18 ENCOUNTER — Ambulatory Visit (HOSPITAL_BASED_OUTPATIENT_CLINIC_OR_DEPARTMENT_OTHER): Payer: 59 | Attending: Family Medicine | Admitting: Physical Therapy

## 2022-02-18 DIAGNOSIS — R262 Difficulty in walking, not elsewhere classified: Secondary | ICD-10-CM | POA: Insufficient documentation

## 2022-02-18 DIAGNOSIS — M6281 Muscle weakness (generalized): Secondary | ICD-10-CM | POA: Diagnosis present

## 2022-02-18 DIAGNOSIS — M25611 Stiffness of right shoulder, not elsewhere classified: Secondary | ICD-10-CM | POA: Insufficient documentation

## 2022-02-18 DIAGNOSIS — M545 Low back pain, unspecified: Secondary | ICD-10-CM | POA: Insufficient documentation
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
## 2022-02-20 ENCOUNTER — Encounter (HOSPITAL_BASED_OUTPATIENT_CLINIC_OR_DEPARTMENT_OTHER): Payer: Self-pay | Admitting: Physical Therapy

## 2022-02-20 ENCOUNTER — Ambulatory Visit (HOSPITAL_BASED_OUTPATIENT_CLINIC_OR_DEPARTMENT_OTHER): Payer: 59 | Admitting: Physical Therapy

## 2022-02-20 DIAGNOSIS — R262 Difficulty in walking, not elsewhere classified: Secondary | ICD-10-CM

## 2022-02-20 DIAGNOSIS — M6281 Muscle weakness (generalized): Secondary | ICD-10-CM

## 2022-02-20 DIAGNOSIS — M545 Low back pain, unspecified: Secondary | ICD-10-CM

## 2022-02-20 DIAGNOSIS — M25611 Stiffness of right shoulder, not elsewhere classified: Secondary | ICD-10-CM
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
## 2022-03-11 ENCOUNTER — Encounter (HOSPITAL_BASED_OUTPATIENT_CLINIC_OR_DEPARTMENT_OTHER): Payer: Self-pay

## 2022-03-11 ENCOUNTER — Ambulatory Visit (HOSPITAL_BASED_OUTPATIENT_CLINIC_OR_DEPARTMENT_OTHER): Payer: 59 | Admitting: Physical Therapy

## 2022-03-13 ENCOUNTER — Ambulatory Visit (HOSPITAL_BASED_OUTPATIENT_CLINIC_OR_DEPARTMENT_OTHER): Payer: 59 | Admitting: Physical Therapy

## 2022-03-13 ENCOUNTER — Encounter (HOSPITAL_BASED_OUTPATIENT_CLINIC_OR_DEPARTMENT_OTHER): Payer: Self-pay | Admitting: Physical Therapy

## 2022-03-13 DIAGNOSIS — M25611 Stiffness of right shoulder, not elsewhere classified: Secondary | ICD-10-CM

## 2022-03-13 DIAGNOSIS — R262 Difficulty in walking, not elsewhere classified: Secondary | ICD-10-CM

## 2022-03-13 DIAGNOSIS — M545 Low back pain, unspecified: Secondary | ICD-10-CM

## 2022-03-13 DIAGNOSIS — M6281 Muscle weakness (generalized): Secondary | ICD-10-CM
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
## 2022-03-15 ENCOUNTER — Encounter (HOSPITAL_BASED_OUTPATIENT_CLINIC_OR_DEPARTMENT_OTHER): Payer: Self-pay | Admitting: Nurse Practitioner

## 2022-03-15 ENCOUNTER — Encounter (HOSPITAL_BASED_OUTPATIENT_CLINIC_OR_DEPARTMENT_OTHER): Payer: Self-pay

## 2022-03-15 ENCOUNTER — Other Ambulatory Visit (HOSPITAL_BASED_OUTPATIENT_CLINIC_OR_DEPARTMENT_OTHER): Payer: Self-pay

## 2022-03-15 ENCOUNTER — Ambulatory Visit (INDEPENDENT_AMBULATORY_CARE_PROVIDER_SITE_OTHER): Payer: 59 | Admitting: Nurse Practitioner

## 2022-03-15 VITALS — BP 128/88 | HR 86 | Ht 73.0 in | Wt 203.8 lb

## 2022-03-15 DIAGNOSIS — M549 Dorsalgia, unspecified: Secondary | ICD-10-CM | POA: Diagnosis not present

## 2022-03-15 DIAGNOSIS — M25562 Pain in left knee: Secondary | ICD-10-CM | POA: Diagnosis not present

## 2022-03-15 DIAGNOSIS — M542 Cervicalgia: Secondary | ICD-10-CM | POA: Diagnosis not present

## 2022-03-15 DIAGNOSIS — G8929 Other chronic pain: Secondary | ICD-10-CM | POA: Insufficient documentation

## 2022-03-15 DIAGNOSIS — M545 Low back pain, unspecified: Secondary | ICD-10-CM

## 2022-03-15 DIAGNOSIS — M25511 Pain in right shoulder: Secondary | ICD-10-CM

## 2022-03-15 DIAGNOSIS — M25512 Pain in left shoulder: Secondary | ICD-10-CM

## 2022-03-15 DIAGNOSIS — L732 Hidradenitis suppurativa: Secondary | ICD-10-CM

## 2022-03-15 MED ORDER — CYCLOBENZAPRINE HCL 10 MG PO TABS
10.0000 mg | ORAL_TABLET | Freq: Three times a day (TID) | ORAL | 3 refills | Status: AC | PRN
  Filled 2022-03-15: qty 30, 10d supply, fill #0
  Filled 2022-05-10 – 2022-05-14 (×2): qty 30, 10d supply, fill #1
  Filled 2022-08-22 – 2022-08-30 (×2): qty 30, 10d supply, fill #2

## 2022-03-15 MED ORDER — KETOROLAC TROMETHAMINE 10 MG PO TABS
10.0000 mg | ORAL_TABLET | Freq: Four times a day (QID) | ORAL | 0 refills | Status: AC | PRN
  Filled 2022-03-15: qty 20, 5d supply, fill #0

## 2022-03-15 MED ORDER — DEXAMETHASONE SODIUM PHOSPHATE 10 MG/ML IJ SOLN: 10.0000 mg | Freq: Once | INTRAMUSCULAR | Status: DC

## 2022-03-15 MED ORDER — DOXYCYCLINE MONOHYDRATE 100 MG PO CAPS
100.0000 mg | ORAL_CAPSULE | Freq: Two times a day (BID) | ORAL | 11 refills | Status: DC
  Filled 2022-03-15: qty 50, 25d supply, fill #0
  Filled 2022-05-10 – 2022-05-14 (×2): qty 50, 25d supply, fill #1
  Filled 2022-07-29 – 2022-08-30 (×3): qty 50, 25d supply, fill #2

## 2022-03-15 MED ORDER — KETOROLAC TROMETHAMINE 60 MG/2ML IM SOLN: 60.0000 mg | Freq: Once | INTRAMUSCULAR | Status: AC

## 2022-03-15 NOTE — Patient Instructions (Addendum)
It was a pleasure seeing you today. I hope your time spent with [place] was pleasant and helpful. Please let us know if there is anything we can do to improve the service you receive.  ? ?Continue with PT and stretching on home.  ?The pool therapy will be helpful with the pain. ?We will give you a shot today to help bring down the pain- this is both antiinflammatory mediation and a steroid to help.  ?I have sent in flexeril to help and ketorolac to help with inflammation-  ? ? ? ?Important Office Information ?Lab Results ?If labs were ordered, please note that you will see results through MyChart as soon as they come available from [facility].  ?It takes up to 5 business days for the results to be routed to me and for me to review them once all of the lab results have come through from [facility]. I will make recommendations based on your results and send these through MyChart or someone from the office will call you to discuss. If your labs are abnormal, we may contact you to schedule a visit to discuss the results and make recommendations.  ?If you have not heard from [place] within 5 business days or you have waited longer than a week and your lab results have not come through on MyChart, please feel free to call the office or send a message through MyChart to follow-up on these labs.  ? ?Referrals ?If referrals were placed today, the office where the referral was sent will contact you either by phone or through MyChart to set up scheduling. Please note that it can take up to a week for the referral office to contact you. If you do not hear from them in a week, please contact the referral office directly to inquire about scheduling.  ? ?Condition Treated ?If your condition worsens or you begin to have new symptoms, please schedule a follow-up appointment for further evaluation. If you are not sure if an appointment is needed, you may call the office to leave a message for the nurse and someone will contact you with  recommendations.  ?If you have an urgent or life threatening emergency, please do not call the office, but seek emergency evaluation by calling 911 or going to the nearest emergency room for evaluation.  ? ?MyChart and Phone Calls ?Please do not use MyChart for urgent messages. It may take up to 3 business days for MyChart messages to be read by staff and if they are unable to handle the request, an additional 3 business days for them to be routed to me and for my response.  ?Messages sent to the provider through MyChart do not come directly to the provider, please allow time for these messages to be routed and for me to respond.  ?We get a large volume of MyChart messages daily and these are responded to in the order received.  ? ?For urgent messages, please call the office at [phone] and speak with the front office staff or leave a message on the line of my assistant for guidance.  ?We are seeing patients from the hours of 8:00 am through 5:00 pm and calls directly to the nurse may not be answered immediately due to seeing patients, but your call will be returned as soon as possible.  ?Phone  messages received after 4:00 PM Monday through Thursday may not be returned until the following business day. Phone messages received after 11:00 AM on Friday may not be returned until  Monday.  ? ?After Hours ?We share on call hours with providers from other offices. If you have an urgent need after hours that cannot wait until the next business day, please contact the on call provider by calling the office number. A nurse will speak with you and contact the provider if needed for recommendations.  ?If you have an urgent or life threatening emergency after hours, please do not call the on call provider, but seek emergency evaluation by calling 911 or going to the nearest emergency room for evaluation.  ? ?Paperwork ?All paperwork requires a minimum of 5 days to complete and return to you or the designated personnel.  Please keep this in mind when bringing in forms or sending requests for paperwork completion to the office.  ?  ?
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
## 2022-03-18 ENCOUNTER — Other Ambulatory Visit (HOSPITAL_BASED_OUTPATIENT_CLINIC_OR_DEPARTMENT_OTHER): Payer: Self-pay

## 2022-03-18 ENCOUNTER — Encounter (HOSPITAL_BASED_OUTPATIENT_CLINIC_OR_DEPARTMENT_OTHER): Payer: Self-pay

## 2022-03-18 ENCOUNTER — Ambulatory Visit (HOSPITAL_BASED_OUTPATIENT_CLINIC_OR_DEPARTMENT_OTHER): Payer: 59 | Admitting: Physical Therapy

## 2022-03-20 ENCOUNTER — Encounter (HOSPITAL_BASED_OUTPATIENT_CLINIC_OR_DEPARTMENT_OTHER): Payer: Self-pay

## 2022-03-20 ENCOUNTER — Ambulatory Visit (HOSPITAL_BASED_OUTPATIENT_CLINIC_OR_DEPARTMENT_OTHER): Payer: 59 | Attending: Family Medicine | Admitting: Physical Therapy

## 2022-03-20 ENCOUNTER — Encounter (HOSPITAL_BASED_OUTPATIENT_CLINIC_OR_DEPARTMENT_OTHER): Payer: Self-pay | Admitting: Physical Therapy

## 2022-03-20 DIAGNOSIS — R262 Difficulty in walking, not elsewhere classified: Secondary | ICD-10-CM | POA: Diagnosis present

## 2022-03-20 DIAGNOSIS — M25611 Stiffness of right shoulder, not elsewhere classified: Secondary | ICD-10-CM | POA: Insufficient documentation

## 2022-03-20 DIAGNOSIS — M5459 Other low back pain: Secondary | ICD-10-CM | POA: Insufficient documentation

## 2022-03-20 DIAGNOSIS — M6281 Muscle weakness (generalized): Secondary | ICD-10-CM | POA: Insufficient documentation

## 2022-03-20 DIAGNOSIS — M25612 Stiffness of left shoulder, not elsewhere classified: Secondary | ICD-10-CM | POA: Insufficient documentation
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
## 2022-03-21 ENCOUNTER — Ambulatory Visit (INDEPENDENT_AMBULATORY_CARE_PROVIDER_SITE_OTHER): Payer: 59 | Admitting: Orthopaedic Surgery

## 2022-03-21 ENCOUNTER — Other Ambulatory Visit (HOSPITAL_BASED_OUTPATIENT_CLINIC_OR_DEPARTMENT_OTHER): Payer: Self-pay

## 2022-03-21 ENCOUNTER — Ambulatory Visit (INDEPENDENT_AMBULATORY_CARE_PROVIDER_SITE_OTHER): Payer: 59

## 2022-03-21 DIAGNOSIS — M222X1 Patellofemoral disorders, right knee: Secondary | ICD-10-CM

## 2022-03-21 DIAGNOSIS — M25561 Pain in right knee: Secondary | ICD-10-CM

## 2022-03-21 DIAGNOSIS — G8929 Other chronic pain: Secondary | ICD-10-CM | POA: Diagnosis not present

## 2022-03-21 DIAGNOSIS — S335XXA Sprain of ligaments of lumbar spine, initial encounter: Secondary | ICD-10-CM

## 2022-03-21 DIAGNOSIS — M222X2 Patellofemoral disorders, left knee: Secondary | ICD-10-CM

## 2022-03-21 DIAGNOSIS — M25562 Pain in left knee: Secondary | ICD-10-CM

## 2022-03-21 MED ORDER — METHOCARBAMOL 500 MG PO TABS
500.0000 mg | ORAL_TABLET | Freq: Two times a day (BID) | ORAL | 1 refills | Status: DC
  Filled 2022-03-21: qty 30, 15d supply, fill #0
  Filled 2022-05-10 – 2022-05-14 (×2): qty 30, 15d supply, fill #1
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
## 2022-03-22 ENCOUNTER — Other Ambulatory Visit (HOSPITAL_BASED_OUTPATIENT_CLINIC_OR_DEPARTMENT_OTHER): Payer: Self-pay

## 2022-03-24 ENCOUNTER — Encounter (HOSPITAL_BASED_OUTPATIENT_CLINIC_OR_DEPARTMENT_OTHER): Payer: Self-pay | Admitting: Nurse Practitioner

## 2022-03-24 NOTE — Assessment & Plan Note (Signed)
Chronic.  Good control with oral doxycycline on daily basis.  He has recently been out of this medication and his symptoms have returned.  We will restart doxycycline.  Patient may benefit from steroid injection with dermatology in the future however we will work to see if we can get control at this time with oral treatment.  Patient will follow-up if symptoms worsen or fail to improve.  No signs of infection present today ?

## 2022-03-24 NOTE — Assessment & Plan Note (Signed)
Chronic.  Patient has tried and failed physical therapy and medication management.  No alarm symptoms present at this time.  Given the ongoing nature of symptoms will send referral to orthopedics for further evaluation.  Discussed with patient that we have completed all the therapies that we can in the primary care setting at this point and further evaluation is warranted.  We will continue to collaborate care with orthopedics. ?
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
## 2022-03-25 ENCOUNTER — Ambulatory Visit (HOSPITAL_BASED_OUTPATIENT_CLINIC_OR_DEPARTMENT_OTHER): Payer: 59 | Admitting: Physical Therapy

## 2022-03-25 ENCOUNTER — Encounter (HOSPITAL_BASED_OUTPATIENT_CLINIC_OR_DEPARTMENT_OTHER): Payer: Self-pay | Admitting: Physical Therapy

## 2022-03-25 DIAGNOSIS — M25611 Stiffness of right shoulder, not elsewhere classified: Secondary | ICD-10-CM | POA: Diagnosis not present

## 2022-03-25 DIAGNOSIS — M5459 Other low back pain: Secondary | ICD-10-CM

## 2022-03-25 DIAGNOSIS — M6281 Muscle weakness (generalized): Secondary | ICD-10-CM

## 2022-03-25 DIAGNOSIS — R262 Difficulty in walking, not elsewhere classified: Secondary | ICD-10-CM
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
## 2022-03-28 ENCOUNTER — Encounter (HOSPITAL_BASED_OUTPATIENT_CLINIC_OR_DEPARTMENT_OTHER): Payer: Self-pay | Admitting: Physical Therapy

## 2022-03-28 ENCOUNTER — Ambulatory Visit (HOSPITAL_BASED_OUTPATIENT_CLINIC_OR_DEPARTMENT_OTHER): Payer: 59 | Admitting: Physical Therapy

## 2022-03-28 DIAGNOSIS — R262 Difficulty in walking, not elsewhere classified: Secondary | ICD-10-CM

## 2022-03-28 DIAGNOSIS — M25611 Stiffness of right shoulder, not elsewhere classified: Secondary | ICD-10-CM

## 2022-03-28 DIAGNOSIS — M6281 Muscle weakness (generalized): Secondary | ICD-10-CM

## 2022-03-28 DIAGNOSIS — M5459 Other low back pain: Secondary | ICD-10-CM
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
## 2022-04-01 ENCOUNTER — Ambulatory Visit (HOSPITAL_BASED_OUTPATIENT_CLINIC_OR_DEPARTMENT_OTHER): Payer: 59 | Admitting: Physical Therapy

## 2022-04-01 ENCOUNTER — Encounter (HOSPITAL_BASED_OUTPATIENT_CLINIC_OR_DEPARTMENT_OTHER): Payer: Self-pay | Admitting: Physical Therapy

## 2022-04-01 DIAGNOSIS — M25611 Stiffness of right shoulder, not elsewhere classified: Secondary | ICD-10-CM

## 2022-04-01 DIAGNOSIS — M5459 Other low back pain: Secondary | ICD-10-CM

## 2022-04-01 DIAGNOSIS — M6281 Muscle weakness (generalized): Secondary | ICD-10-CM

## 2022-04-01 DIAGNOSIS — R262 Difficulty in walking, not elsewhere classified: Secondary | ICD-10-CM
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
## 2022-04-04 ENCOUNTER — Ambulatory Visit (HOSPITAL_BASED_OUTPATIENT_CLINIC_OR_DEPARTMENT_OTHER): Payer: 59 | Admitting: Physical Therapy

## 2022-04-04 ENCOUNTER — Encounter (HOSPITAL_BASED_OUTPATIENT_CLINIC_OR_DEPARTMENT_OTHER): Payer: Self-pay | Admitting: Physical Therapy

## 2022-04-04 DIAGNOSIS — R262 Difficulty in walking, not elsewhere classified: Secondary | ICD-10-CM

## 2022-04-04 DIAGNOSIS — M6281 Muscle weakness (generalized): Secondary | ICD-10-CM

## 2022-04-04 DIAGNOSIS — M25611 Stiffness of right shoulder, not elsewhere classified: Secondary | ICD-10-CM | POA: Diagnosis not present

## 2022-04-04 DIAGNOSIS — M5459 Other low back pain: Secondary | ICD-10-CM
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
## 2022-04-08 ENCOUNTER — Ambulatory Visit (HOSPITAL_BASED_OUTPATIENT_CLINIC_OR_DEPARTMENT_OTHER): Payer: 59 | Admitting: Physical Therapy

## 2022-04-08 ENCOUNTER — Encounter (HOSPITAL_BASED_OUTPATIENT_CLINIC_OR_DEPARTMENT_OTHER): Payer: Self-pay | Admitting: Physical Therapy

## 2022-04-08 DIAGNOSIS — M6281 Muscle weakness (generalized): Secondary | ICD-10-CM

## 2022-04-08 DIAGNOSIS — M5459 Other low back pain: Secondary | ICD-10-CM

## 2022-04-08 DIAGNOSIS — M25611 Stiffness of right shoulder, not elsewhere classified: Secondary | ICD-10-CM

## 2022-04-08 DIAGNOSIS — R262 Difficulty in walking, not elsewhere classified: Secondary | ICD-10-CM
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
## 2022-04-11 ENCOUNTER — Ambulatory Visit (HOSPITAL_BASED_OUTPATIENT_CLINIC_OR_DEPARTMENT_OTHER): Payer: 59 | Admitting: Physical Therapy

## 2022-04-11 DIAGNOSIS — M6281 Muscle weakness (generalized): Secondary | ICD-10-CM

## 2022-04-11 DIAGNOSIS — M25612 Stiffness of left shoulder, not elsewhere classified: Secondary | ICD-10-CM

## 2022-04-11 DIAGNOSIS — M25611 Stiffness of right shoulder, not elsewhere classified: Secondary | ICD-10-CM

## 2022-04-11 DIAGNOSIS — R262 Difficulty in walking, not elsewhere classified: Secondary | ICD-10-CM

## 2022-04-11 DIAGNOSIS — M5459 Other low back pain: Secondary | ICD-10-CM
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
## 2022-04-16 ENCOUNTER — Encounter (HOSPITAL_BASED_OUTPATIENT_CLINIC_OR_DEPARTMENT_OTHER): Payer: Self-pay | Admitting: Physical Therapy

## 2022-04-16 ENCOUNTER — Ambulatory Visit (HOSPITAL_BASED_OUTPATIENT_CLINIC_OR_DEPARTMENT_OTHER): Payer: 59 | Admitting: Physical Therapy

## 2022-04-16 DIAGNOSIS — M6281 Muscle weakness (generalized): Secondary | ICD-10-CM

## 2022-04-16 DIAGNOSIS — M25611 Stiffness of right shoulder, not elsewhere classified: Secondary | ICD-10-CM

## 2022-04-16 DIAGNOSIS — M25612 Stiffness of left shoulder, not elsewhere classified: Secondary | ICD-10-CM

## 2022-04-16 DIAGNOSIS — M5459 Other low back pain: Secondary | ICD-10-CM

## 2022-04-16 DIAGNOSIS — R262 Difficulty in walking, not elsewhere classified: Secondary | ICD-10-CM
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
## 2022-04-18 ENCOUNTER — Encounter (HOSPITAL_BASED_OUTPATIENT_CLINIC_OR_DEPARTMENT_OTHER): Payer: Self-pay | Admitting: Physical Therapy

## 2022-04-18 ENCOUNTER — Ambulatory Visit (HOSPITAL_BASED_OUTPATIENT_CLINIC_OR_DEPARTMENT_OTHER): Payer: 59 | Attending: Family Medicine | Admitting: Physical Therapy

## 2022-04-18 DIAGNOSIS — M25611 Stiffness of right shoulder, not elsewhere classified: Secondary | ICD-10-CM | POA: Diagnosis present

## 2022-04-18 DIAGNOSIS — R262 Difficulty in walking, not elsewhere classified: Secondary | ICD-10-CM | POA: Diagnosis present

## 2022-04-18 DIAGNOSIS — M25612 Stiffness of left shoulder, not elsewhere classified: Secondary | ICD-10-CM | POA: Diagnosis present

## 2022-04-18 DIAGNOSIS — M6281 Muscle weakness (generalized): Secondary | ICD-10-CM | POA: Diagnosis present

## 2022-04-18 DIAGNOSIS — M5459 Other low back pain: Secondary | ICD-10-CM | POA: Diagnosis present
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
## 2022-04-19 ENCOUNTER — Ambulatory Visit (INDEPENDENT_AMBULATORY_CARE_PROVIDER_SITE_OTHER): Payer: 59 | Admitting: Orthopaedic Surgery

## 2022-04-19 ENCOUNTER — Other Ambulatory Visit (HOSPITAL_BASED_OUTPATIENT_CLINIC_OR_DEPARTMENT_OTHER): Payer: Self-pay

## 2022-04-19 DIAGNOSIS — S335XXA Sprain of ligaments of lumbar spine, initial encounter: Secondary | ICD-10-CM

## 2022-04-19 MED ORDER — METHYLPREDNISOLONE 4 MG PO TBPK
ORAL_TABLET | ORAL | 0 refills | Status: DC
  Filled 2022-04-19: qty 21, 6d supply, fill #0
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
## 2022-04-23 ENCOUNTER — Encounter (HOSPITAL_BASED_OUTPATIENT_CLINIC_OR_DEPARTMENT_OTHER): Payer: Self-pay | Admitting: Physical Therapy

## 2022-04-23 ENCOUNTER — Ambulatory Visit (HOSPITAL_BASED_OUTPATIENT_CLINIC_OR_DEPARTMENT_OTHER): Payer: 59 | Admitting: Physical Therapy

## 2022-04-23 DIAGNOSIS — M25611 Stiffness of right shoulder, not elsewhere classified: Secondary | ICD-10-CM

## 2022-04-23 DIAGNOSIS — M5459 Other low back pain: Secondary | ICD-10-CM

## 2022-04-23 DIAGNOSIS — R262 Difficulty in walking, not elsewhere classified: Secondary | ICD-10-CM

## 2022-04-23 DIAGNOSIS — M6281 Muscle weakness (generalized): Secondary | ICD-10-CM

## 2022-04-23 DIAGNOSIS — M25612 Stiffness of left shoulder, not elsewhere classified: Secondary | ICD-10-CM
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
## 2022-04-25 ENCOUNTER — Ambulatory Visit (HOSPITAL_BASED_OUTPATIENT_CLINIC_OR_DEPARTMENT_OTHER): Payer: 59 | Admitting: Physical Therapy

## 2022-04-25 ENCOUNTER — Encounter (HOSPITAL_BASED_OUTPATIENT_CLINIC_OR_DEPARTMENT_OTHER): Payer: Self-pay | Admitting: Physical Therapy

## 2022-04-25 DIAGNOSIS — R262 Difficulty in walking, not elsewhere classified: Secondary | ICD-10-CM

## 2022-04-25 DIAGNOSIS — M5459 Other low back pain: Secondary | ICD-10-CM

## 2022-04-25 DIAGNOSIS — M25611 Stiffness of right shoulder, not elsewhere classified: Secondary | ICD-10-CM

## 2022-04-25 DIAGNOSIS — M25612 Stiffness of left shoulder, not elsewhere classified: Secondary | ICD-10-CM

## 2022-04-25 DIAGNOSIS — M6281 Muscle weakness (generalized): Secondary | ICD-10-CM
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
## 2022-04-30 ENCOUNTER — Ambulatory Visit (HOSPITAL_BASED_OUTPATIENT_CLINIC_OR_DEPARTMENT_OTHER): Payer: 59 | Admitting: Physical Therapy

## 2022-04-30 ENCOUNTER — Encounter (HOSPITAL_BASED_OUTPATIENT_CLINIC_OR_DEPARTMENT_OTHER): Payer: Self-pay | Admitting: Physical Therapy

## 2022-04-30 DIAGNOSIS — M25611 Stiffness of right shoulder, not elsewhere classified: Secondary | ICD-10-CM | POA: Diagnosis not present

## 2022-04-30 DIAGNOSIS — M6281 Muscle weakness (generalized): Secondary | ICD-10-CM

## 2022-04-30 DIAGNOSIS — M5459 Other low back pain: Secondary | ICD-10-CM

## 2022-04-30 DIAGNOSIS — M25612 Stiffness of left shoulder, not elsewhere classified: Secondary | ICD-10-CM

## 2022-04-30 DIAGNOSIS — R262 Difficulty in walking, not elsewhere classified: Secondary | ICD-10-CM
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
## 2022-05-02 ENCOUNTER — Encounter (HOSPITAL_BASED_OUTPATIENT_CLINIC_OR_DEPARTMENT_OTHER): Payer: Self-pay

## 2022-05-02 ENCOUNTER — Ambulatory Visit (HOSPITAL_BASED_OUTPATIENT_CLINIC_OR_DEPARTMENT_OTHER): Payer: 59 | Admitting: Physical Therapy

## 2022-05-07 ENCOUNTER — Ambulatory Visit (HOSPITAL_BASED_OUTPATIENT_CLINIC_OR_DEPARTMENT_OTHER): Payer: 59 | Admitting: Physical Therapy

## 2022-05-07 ENCOUNTER — Encounter (HOSPITAL_BASED_OUTPATIENT_CLINIC_OR_DEPARTMENT_OTHER): Payer: Self-pay | Admitting: Physical Therapy

## 2022-05-07 DIAGNOSIS — M25612 Stiffness of left shoulder, not elsewhere classified: Secondary | ICD-10-CM

## 2022-05-07 DIAGNOSIS — R262 Difficulty in walking, not elsewhere classified: Secondary | ICD-10-CM

## 2022-05-07 DIAGNOSIS — M6281 Muscle weakness (generalized): Secondary | ICD-10-CM

## 2022-05-07 DIAGNOSIS — M5459 Other low back pain: Secondary | ICD-10-CM

## 2022-05-07 DIAGNOSIS — M25611 Stiffness of right shoulder, not elsewhere classified: Secondary | ICD-10-CM | POA: Diagnosis not present
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
## 2022-05-09 ENCOUNTER — Encounter (HOSPITAL_BASED_OUTPATIENT_CLINIC_OR_DEPARTMENT_OTHER): Payer: Self-pay | Admitting: Physical Therapy

## 2022-05-09 ENCOUNTER — Ambulatory Visit (HOSPITAL_BASED_OUTPATIENT_CLINIC_OR_DEPARTMENT_OTHER): Payer: 59 | Admitting: Physical Therapy

## 2022-05-09 DIAGNOSIS — R262 Difficulty in walking, not elsewhere classified: Secondary | ICD-10-CM

## 2022-05-09 DIAGNOSIS — M25611 Stiffness of right shoulder, not elsewhere classified: Secondary | ICD-10-CM | POA: Diagnosis not present

## 2022-05-09 DIAGNOSIS — M25612 Stiffness of left shoulder, not elsewhere classified: Secondary | ICD-10-CM

## 2022-05-09 DIAGNOSIS — M5459 Other low back pain: Secondary | ICD-10-CM

## 2022-05-09 DIAGNOSIS — M6281 Muscle weakness (generalized): Secondary | ICD-10-CM
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
## 2022-05-10 ENCOUNTER — Other Ambulatory Visit (HOSPITAL_COMMUNITY): Payer: Self-pay

## 2022-05-13 ENCOUNTER — Other Ambulatory Visit (HOSPITAL_COMMUNITY): Payer: Self-pay

## 2022-05-14 ENCOUNTER — Other Ambulatory Visit (HOSPITAL_BASED_OUTPATIENT_CLINIC_OR_DEPARTMENT_OTHER): Payer: Self-pay

## 2022-05-14 ENCOUNTER — Other Ambulatory Visit (HOSPITAL_COMMUNITY): Payer: Self-pay

## 2022-05-14 ENCOUNTER — Ambulatory Visit (HOSPITAL_BASED_OUTPATIENT_CLINIC_OR_DEPARTMENT_OTHER): Payer: 59 | Admitting: Physical Therapy

## 2022-05-14 ENCOUNTER — Encounter (HOSPITAL_BASED_OUTPATIENT_CLINIC_OR_DEPARTMENT_OTHER): Payer: Self-pay | Admitting: Physical Therapy

## 2022-05-14 DIAGNOSIS — M25611 Stiffness of right shoulder, not elsewhere classified: Secondary | ICD-10-CM

## 2022-05-14 DIAGNOSIS — M6281 Muscle weakness (generalized): Secondary | ICD-10-CM

## 2022-05-14 DIAGNOSIS — M25612 Stiffness of left shoulder, not elsewhere classified: Secondary | ICD-10-CM

## 2022-05-14 DIAGNOSIS — M5459 Other low back pain: Secondary | ICD-10-CM

## 2022-05-14 DIAGNOSIS — R262 Difficulty in walking, not elsewhere classified: Secondary | ICD-10-CM

## 2022-05-16 ENCOUNTER — Ambulatory Visit (HOSPITAL_BASED_OUTPATIENT_CLINIC_OR_DEPARTMENT_OTHER): Payer: 59 | Admitting: Physical Therapy

## 2022-05-16 ENCOUNTER — Encounter (HOSPITAL_BASED_OUTPATIENT_CLINIC_OR_DEPARTMENT_OTHER): Payer: Self-pay | Admitting: Physical Therapy

## 2022-05-16 DIAGNOSIS — M25612 Stiffness of left shoulder, not elsewhere classified: Secondary | ICD-10-CM

## 2022-05-16 DIAGNOSIS — M6281 Muscle weakness (generalized): Secondary | ICD-10-CM

## 2022-05-16 DIAGNOSIS — M5459 Other low back pain: Secondary | ICD-10-CM

## 2022-05-16 DIAGNOSIS — M25611 Stiffness of right shoulder, not elsewhere classified: Secondary | ICD-10-CM

## 2022-05-16 DIAGNOSIS — R262 Difficulty in walking, not elsewhere classified: Secondary | ICD-10-CM
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
## 2022-05-22 ENCOUNTER — Encounter (HOSPITAL_BASED_OUTPATIENT_CLINIC_OR_DEPARTMENT_OTHER): Payer: Self-pay | Admitting: Physical Therapy

## 2022-05-22 ENCOUNTER — Ambulatory Visit (HOSPITAL_BASED_OUTPATIENT_CLINIC_OR_DEPARTMENT_OTHER): Payer: 59 | Attending: Family Medicine | Admitting: Physical Therapy

## 2022-05-22 DIAGNOSIS — M25612 Stiffness of left shoulder, not elsewhere classified: Secondary | ICD-10-CM | POA: Insufficient documentation

## 2022-05-22 DIAGNOSIS — M25611 Stiffness of right shoulder, not elsewhere classified: Secondary | ICD-10-CM | POA: Insufficient documentation

## 2022-05-22 DIAGNOSIS — R262 Difficulty in walking, not elsewhere classified: Secondary | ICD-10-CM | POA: Insufficient documentation

## 2022-05-22 DIAGNOSIS — M6281 Muscle weakness (generalized): Secondary | ICD-10-CM | POA: Insufficient documentation

## 2022-05-22 DIAGNOSIS — M12812 Other specific arthropathies, not elsewhere classified, left shoulder: Secondary | ICD-10-CM | POA: Diagnosis present

## 2022-05-22 DIAGNOSIS — M5459 Other low back pain: Secondary | ICD-10-CM | POA: Diagnosis present
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
## 2022-05-22 NOTE — Addendum Note (Signed)
Addended by: Zebedee Iba on: 05/22/2022 09:47 AM   Modules accepted: Orders

## 2022-05-24 ENCOUNTER — Ambulatory Visit (HOSPITAL_BASED_OUTPATIENT_CLINIC_OR_DEPARTMENT_OTHER): Payer: Self-pay | Admitting: Physical Therapy

## 2022-05-28 ENCOUNTER — Encounter (HOSPITAL_BASED_OUTPATIENT_CLINIC_OR_DEPARTMENT_OTHER): Payer: Self-pay | Admitting: Physical Therapy

## 2022-05-28 ENCOUNTER — Ambulatory Visit (HOSPITAL_BASED_OUTPATIENT_CLINIC_OR_DEPARTMENT_OTHER): Payer: 59 | Admitting: Physical Therapy

## 2022-05-28 DIAGNOSIS — M25611 Stiffness of right shoulder, not elsewhere classified: Secondary | ICD-10-CM | POA: Diagnosis not present

## 2022-05-28 DIAGNOSIS — M6281 Muscle weakness (generalized): Secondary | ICD-10-CM

## 2022-05-28 DIAGNOSIS — M25612 Stiffness of left shoulder, not elsewhere classified: Secondary | ICD-10-CM

## 2022-05-28 DIAGNOSIS — R262 Difficulty in walking, not elsewhere classified: Secondary | ICD-10-CM

## 2022-05-28 DIAGNOSIS — M5459 Other low back pain: Secondary | ICD-10-CM
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
## 2022-05-29 ENCOUNTER — Ambulatory Visit (INDEPENDENT_AMBULATORY_CARE_PROVIDER_SITE_OTHER): Payer: 59 | Admitting: Orthopaedic Surgery

## 2022-05-29 ENCOUNTER — Ambulatory Visit (INDEPENDENT_AMBULATORY_CARE_PROVIDER_SITE_OTHER): Payer: 59

## 2022-05-29 DIAGNOSIS — M25512 Pain in left shoulder: Secondary | ICD-10-CM | POA: Diagnosis not present

## 2022-05-29 DIAGNOSIS — M7522 Bicipital tendinitis, left shoulder: Secondary | ICD-10-CM

## 2022-05-29 DIAGNOSIS — G8929 Other chronic pain: Secondary | ICD-10-CM

## 2022-05-29 DIAGNOSIS — M12812 Other specific arthropathies, not elsewhere classified, left shoulder: Secondary | ICD-10-CM

## 2022-05-29 MED ORDER — TRIAMCINOLONE ACETONIDE 40 MG/ML IJ SUSP
80.0000 mg | INTRAMUSCULAR | Status: AC | PRN
  Administered 2022-05-29: 80 mg via INTRA_ARTICULAR

## 2022-05-29 MED ORDER — LIDOCAINE HCL 1 % IJ SOLN
4.0000 mL | INTRAMUSCULAR | Status: AC | PRN
  Administered 2022-05-29: 4 mL
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
## 2022-05-30 ENCOUNTER — Ambulatory Visit (HOSPITAL_BASED_OUTPATIENT_CLINIC_OR_DEPARTMENT_OTHER): Payer: Self-pay | Admitting: Physical Therapy

## 2022-06-03 ENCOUNTER — Ambulatory Visit (HOSPITAL_BASED_OUTPATIENT_CLINIC_OR_DEPARTMENT_OTHER): Payer: 59 | Admitting: Orthopaedic Surgery

## 2022-06-04 ENCOUNTER — Encounter (HOSPITAL_BASED_OUTPATIENT_CLINIC_OR_DEPARTMENT_OTHER): Payer: Self-pay | Admitting: Physical Therapy

## 2022-06-04 ENCOUNTER — Ambulatory Visit (HOSPITAL_BASED_OUTPATIENT_CLINIC_OR_DEPARTMENT_OTHER): Payer: 59 | Admitting: Physical Therapy

## 2022-06-04 DIAGNOSIS — R262 Difficulty in walking, not elsewhere classified: Secondary | ICD-10-CM

## 2022-06-04 DIAGNOSIS — M25612 Stiffness of left shoulder, not elsewhere classified: Secondary | ICD-10-CM

## 2022-06-04 DIAGNOSIS — M25611 Stiffness of right shoulder, not elsewhere classified: Secondary | ICD-10-CM | POA: Diagnosis not present

## 2022-06-04 DIAGNOSIS — M6281 Muscle weakness (generalized): Secondary | ICD-10-CM
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
## 2022-06-06 ENCOUNTER — Ambulatory Visit (HOSPITAL_BASED_OUTPATIENT_CLINIC_OR_DEPARTMENT_OTHER): Payer: Self-pay | Admitting: Physical Therapy

## 2022-06-14 ENCOUNTER — Encounter (HOSPITAL_BASED_OUTPATIENT_CLINIC_OR_DEPARTMENT_OTHER): Payer: Self-pay | Admitting: Physical Therapy

## 2022-06-14 ENCOUNTER — Encounter (HOSPITAL_BASED_OUTPATIENT_CLINIC_OR_DEPARTMENT_OTHER): Payer: Self-pay

## 2022-06-14 ENCOUNTER — Ambulatory Visit (HOSPITAL_BASED_OUTPATIENT_CLINIC_OR_DEPARTMENT_OTHER): Payer: 59 | Admitting: Physical Therapy

## 2022-06-14 DIAGNOSIS — M6281 Muscle weakness (generalized): Secondary | ICD-10-CM

## 2022-06-14 DIAGNOSIS — M5459 Other low back pain: Secondary | ICD-10-CM

## 2022-06-14 DIAGNOSIS — R262 Difficulty in walking, not elsewhere classified: Secondary | ICD-10-CM

## 2022-06-14 DIAGNOSIS — M25611 Stiffness of right shoulder, not elsewhere classified: Secondary | ICD-10-CM

## 2022-06-14 DIAGNOSIS — M25612 Stiffness of left shoulder, not elsewhere classified: Secondary | ICD-10-CM
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
## 2022-06-18 ENCOUNTER — Ambulatory Visit (HOSPITAL_BASED_OUTPATIENT_CLINIC_OR_DEPARTMENT_OTHER): Payer: 59 | Admitting: Physical Therapy

## 2022-06-18 ENCOUNTER — Encounter (HOSPITAL_BASED_OUTPATIENT_CLINIC_OR_DEPARTMENT_OTHER): Payer: Self-pay

## 2022-06-19 ENCOUNTER — Ambulatory Visit (INDEPENDENT_AMBULATORY_CARE_PROVIDER_SITE_OTHER): Payer: 59 | Admitting: Orthopaedic Surgery

## 2022-06-19 ENCOUNTER — Encounter (HOSPITAL_BASED_OUTPATIENT_CLINIC_OR_DEPARTMENT_OTHER): Payer: Self-pay | Admitting: Orthopaedic Surgery

## 2022-06-19 DIAGNOSIS — G8929 Other chronic pain: Secondary | ICD-10-CM | POA: Diagnosis not present

## 2022-06-19 DIAGNOSIS — M25561 Pain in right knee: Secondary | ICD-10-CM | POA: Diagnosis not present

## 2022-06-19 DIAGNOSIS — M25562 Pain in left knee: Secondary | ICD-10-CM
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
## 2022-06-25 ENCOUNTER — Encounter (HOSPITAL_BASED_OUTPATIENT_CLINIC_OR_DEPARTMENT_OTHER): Payer: Self-pay

## 2022-06-25 ENCOUNTER — Ambulatory Visit (INDEPENDENT_AMBULATORY_CARE_PROVIDER_SITE_OTHER): Payer: 59

## 2022-06-25 ENCOUNTER — Ambulatory Visit (INDEPENDENT_AMBULATORY_CARE_PROVIDER_SITE_OTHER): Payer: 59 | Admitting: Nurse Practitioner

## 2022-06-25 ENCOUNTER — Encounter (HOSPITAL_BASED_OUTPATIENT_CLINIC_OR_DEPARTMENT_OTHER): Payer: Self-pay | Admitting: Nurse Practitioner

## 2022-06-25 ENCOUNTER — Ambulatory Visit (HOSPITAL_BASED_OUTPATIENT_CLINIC_OR_DEPARTMENT_OTHER): Payer: 59 | Admitting: Physical Therapy

## 2022-06-25 VITALS — BP 140/103 | HR 83 | Ht 73.0 in | Wt 198.8 lb

## 2022-06-25 DIAGNOSIS — M79672 Pain in left foot: Secondary | ICD-10-CM | POA: Diagnosis not present

## 2022-06-25 NOTE — Progress Notes (Signed)
  [name], DNP, AGNP-c [facility] [address]  [address] [place], [place] [address] [phone]  Subjective:   Jeffrey Bennett is a 36 y.o. male presents to day for ***   PMH, Medications, and Allergies reviewed and updated in chart.   ROS negative except for what is listed in HPI. Objective:  BP (!) 140/103   Pulse 83   Ht 6\' 1"  (1.854 m)   Wt 198 lb 12.8 oz (90.2 kg)   SpO2 100%   BMI 26.23 kg/m  Physical Exam        Assessment & Plan:   Problem List Items Addressed This Visit   None    , DNP, AGNP-c 06/25/2022  11:58 AM

## 2022-06-25 NOTE — Patient Instructions (Signed)
I want you to get x-rays of the foot today so we can make sure that there is not a hairline fracture.   If there is no fracture, there is likely a tendon or ligament injury. I want you to wear the boot for at least 4 weeks. If it doesn't seem to be improving I want you to come back and see Dr. Ihor Dow with sports medicine for further recommendations.

## 2022-06-26 DIAGNOSIS — M79672 Pain in left foot: Secondary | ICD-10-CM | POA: Insufficient documentation

## 2022-06-26 NOTE — Assessment & Plan Note (Signed)
Sudden onset of left foot pain with severe pain with ambulation.  Etiology at this time is unclear.  Consider possible hairline fracture related to recent increase in activity and walking after going back to work following prolonged.  Off.  Patient trialed in postop shoe however the support was not enough to allow him to ambulate safely.  Trial of ankle boot did show ability to tolerate ambulation.  Will get x-ray of the foot for further evaluation.  Encouraged patient to continue to ice the foot and keep it elevated.  Recommend avoidance of prolonged walking if possible.  He did recently just go back to work after prolonged period off so at this time I do not want to take him off work unless absolutely necessary.  Will monitor x-ray results and plan for orthopedic referral if fracture is noted.

## 2022-06-29 ENCOUNTER — Ambulatory Visit
Admission: RE | Admit: 2022-06-29 | Discharge: 2022-06-29 | Disposition: A | Discharge status: Home / Self Care | Payer: 59 | Source: Ambulatory Visit | Attending: Orthopaedic Surgery | Admitting: Orthopaedic Surgery

## 2022-06-29 DIAGNOSIS — G8929 Other chronic pain: Secondary | ICD-10-CM

## 2022-07-04 ENCOUNTER — Ambulatory Visit (INDEPENDENT_AMBULATORY_CARE_PROVIDER_SITE_OTHER): Payer: 59 | Admitting: Orthopaedic Surgery

## 2022-07-04 ENCOUNTER — Other Ambulatory Visit (HOSPITAL_BASED_OUTPATIENT_CLINIC_OR_DEPARTMENT_OTHER): Payer: Self-pay

## 2022-07-04 DIAGNOSIS — M222X2 Patellofemoral disorders, left knee: Secondary | ICD-10-CM

## 2022-07-04 DIAGNOSIS — M222X1 Patellofemoral disorders, right knee: Secondary | ICD-10-CM

## 2022-07-04 MED ORDER — MELOXICAM 15 MG PO TABS
15.0000 mg | ORAL_TABLET | Freq: Every day | ORAL | 2 refills | Status: DC
  Filled 2022-07-04: qty 30, 30d supply, fill #0
  Filled 2022-07-29 – 2022-08-30 (×3): qty 30, 30d supply, fill #1
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
## 2022-07-10 ENCOUNTER — Ambulatory Visit (HOSPITAL_BASED_OUTPATIENT_CLINIC_OR_DEPARTMENT_OTHER): Payer: 59 | Admitting: Orthopaedic Surgery

## 2022-07-16 ENCOUNTER — Ambulatory Visit (INDEPENDENT_AMBULATORY_CARE_PROVIDER_SITE_OTHER): Payer: 59 | Admitting: Physical Therapy

## 2022-07-16 ENCOUNTER — Encounter: Payer: Self-pay | Admitting: Physical Therapy

## 2022-07-16 DIAGNOSIS — M25562 Pain in left knee: Secondary | ICD-10-CM | POA: Diagnosis not present

## 2022-07-16 DIAGNOSIS — M25662 Stiffness of left knee, not elsewhere classified: Secondary | ICD-10-CM

## 2022-07-16 DIAGNOSIS — M25661 Stiffness of right knee, not elsewhere classified: Secondary | ICD-10-CM

## 2022-07-16 DIAGNOSIS — G8929 Other chronic pain: Secondary | ICD-10-CM

## 2022-07-16 DIAGNOSIS — M25561 Pain in right knee: Secondary | ICD-10-CM

## 2022-07-16 DIAGNOSIS — R262 Difficulty in walking, not elsewhere classified: Secondary | ICD-10-CM
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
## 2022-07-17 ENCOUNTER — Ambulatory Visit (HOSPITAL_BASED_OUTPATIENT_CLINIC_OR_DEPARTMENT_OTHER): Payer: 59 | Admitting: Orthopaedic Surgery

## 2022-07-18 ENCOUNTER — Encounter: Payer: Self-pay | Admitting: Physical Therapy

## 2022-07-18 ENCOUNTER — Ambulatory Visit (INDEPENDENT_AMBULATORY_CARE_PROVIDER_SITE_OTHER): Payer: 59 | Admitting: Physical Therapy

## 2022-07-18 DIAGNOSIS — G8929 Other chronic pain: Secondary | ICD-10-CM

## 2022-07-18 DIAGNOSIS — M25561 Pain in right knee: Secondary | ICD-10-CM | POA: Diagnosis not present

## 2022-07-18 DIAGNOSIS — M25662 Stiffness of left knee, not elsewhere classified: Secondary | ICD-10-CM

## 2022-07-18 DIAGNOSIS — R262 Difficulty in walking, not elsewhere classified: Secondary | ICD-10-CM

## 2022-07-18 DIAGNOSIS — M25661 Stiffness of right knee, not elsewhere classified: Secondary | ICD-10-CM

## 2022-07-18 DIAGNOSIS — M25562 Pain in left knee: Secondary | ICD-10-CM
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
## 2022-07-29 ENCOUNTER — Other Ambulatory Visit (HOSPITAL_COMMUNITY): Payer: Self-pay

## 2022-07-29 ENCOUNTER — Encounter: Payer: Self-pay | Admitting: Rehabilitative and Restorative Service Providers"

## 2022-07-29 ENCOUNTER — Ambulatory Visit (INDEPENDENT_AMBULATORY_CARE_PROVIDER_SITE_OTHER): Payer: 59 | Admitting: Rehabilitative and Restorative Service Providers"

## 2022-07-29 DIAGNOSIS — R262 Difficulty in walking, not elsewhere classified: Secondary | ICD-10-CM

## 2022-07-29 DIAGNOSIS — M25561 Pain in right knee: Secondary | ICD-10-CM | POA: Diagnosis not present

## 2022-07-29 DIAGNOSIS — M25562 Pain in left knee: Secondary | ICD-10-CM | POA: Diagnosis not present

## 2022-07-29 DIAGNOSIS — M25662 Stiffness of left knee, not elsewhere classified: Secondary | ICD-10-CM

## 2022-07-29 DIAGNOSIS — G8929 Other chronic pain: Secondary | ICD-10-CM

## 2022-07-29 DIAGNOSIS — M25661 Stiffness of right knee, not elsewhere classified: Secondary | ICD-10-CM
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
## 2022-07-30 ENCOUNTER — Other Ambulatory Visit (HOSPITAL_COMMUNITY): Payer: Self-pay

## 2022-07-31 ENCOUNTER — Ambulatory Visit (INDEPENDENT_AMBULATORY_CARE_PROVIDER_SITE_OTHER): Payer: 59 | Admitting: Rehabilitative and Restorative Service Providers"

## 2022-07-31 ENCOUNTER — Encounter: Payer: Self-pay | Admitting: Rehabilitative and Restorative Service Providers"

## 2022-07-31 DIAGNOSIS — G8929 Other chronic pain: Secondary | ICD-10-CM

## 2022-07-31 DIAGNOSIS — M25561 Pain in right knee: Secondary | ICD-10-CM

## 2022-07-31 DIAGNOSIS — M25662 Stiffness of left knee, not elsewhere classified: Secondary | ICD-10-CM

## 2022-07-31 DIAGNOSIS — R262 Difficulty in walking, not elsewhere classified: Secondary | ICD-10-CM

## 2022-07-31 DIAGNOSIS — M25661 Stiffness of right knee, not elsewhere classified: Secondary | ICD-10-CM | POA: Diagnosis not present

## 2022-07-31 DIAGNOSIS — M25562 Pain in left knee: Secondary | ICD-10-CM
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
## 2022-08-07 ENCOUNTER — Other Ambulatory Visit (HOSPITAL_COMMUNITY): Payer: Self-pay

## 2022-08-07 ENCOUNTER — Ambulatory Visit (INDEPENDENT_AMBULATORY_CARE_PROVIDER_SITE_OTHER): Payer: 59 | Admitting: Physical Therapy

## 2022-08-07 ENCOUNTER — Encounter: Payer: Self-pay | Admitting: Physical Therapy

## 2022-08-07 DIAGNOSIS — M25661 Stiffness of right knee, not elsewhere classified: Secondary | ICD-10-CM | POA: Diagnosis not present

## 2022-08-07 DIAGNOSIS — M25662 Stiffness of left knee, not elsewhere classified: Secondary | ICD-10-CM | POA: Diagnosis not present

## 2022-08-07 DIAGNOSIS — M25562 Pain in left knee: Secondary | ICD-10-CM

## 2022-08-07 DIAGNOSIS — R262 Difficulty in walking, not elsewhere classified: Secondary | ICD-10-CM

## 2022-08-07 DIAGNOSIS — M25561 Pain in right knee: Secondary | ICD-10-CM | POA: Diagnosis not present

## 2022-08-07 DIAGNOSIS — G8929 Other chronic pain: Secondary | ICD-10-CM
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
## 2022-08-09 ENCOUNTER — Encounter: Payer: Self-pay | Admitting: Physical Therapy

## 2022-08-09 ENCOUNTER — Ambulatory Visit (INDEPENDENT_AMBULATORY_CARE_PROVIDER_SITE_OTHER): Payer: 59 | Admitting: Physical Therapy

## 2022-08-09 DIAGNOSIS — G8929 Other chronic pain: Secondary | ICD-10-CM

## 2022-08-09 DIAGNOSIS — M25662 Stiffness of left knee, not elsewhere classified: Secondary | ICD-10-CM

## 2022-08-09 DIAGNOSIS — M25562 Pain in left knee: Secondary | ICD-10-CM | POA: Diagnosis not present

## 2022-08-09 DIAGNOSIS — R262 Difficulty in walking, not elsewhere classified: Secondary | ICD-10-CM

## 2022-08-09 DIAGNOSIS — M25661 Stiffness of right knee, not elsewhere classified: Secondary | ICD-10-CM

## 2022-08-09 DIAGNOSIS — M25561 Pain in right knee: Secondary | ICD-10-CM
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
## 2022-08-13 ENCOUNTER — Ambulatory Visit (INDEPENDENT_AMBULATORY_CARE_PROVIDER_SITE_OTHER): Payer: 59 | Admitting: Rehabilitative and Restorative Service Providers"

## 2022-08-13 ENCOUNTER — Encounter: Payer: Self-pay | Admitting: Rehabilitative and Restorative Service Providers"

## 2022-08-13 DIAGNOSIS — M25662 Stiffness of left knee, not elsewhere classified: Secondary | ICD-10-CM | POA: Diagnosis not present

## 2022-08-13 DIAGNOSIS — R262 Difficulty in walking, not elsewhere classified: Secondary | ICD-10-CM

## 2022-08-13 DIAGNOSIS — M25562 Pain in left knee: Secondary | ICD-10-CM

## 2022-08-13 DIAGNOSIS — G8929 Other chronic pain: Secondary | ICD-10-CM

## 2022-08-13 DIAGNOSIS — M25661 Stiffness of right knee, not elsewhere classified: Secondary | ICD-10-CM

## 2022-08-13 DIAGNOSIS — M25561 Pain in right knee: Secondary | ICD-10-CM
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
## 2022-08-15 ENCOUNTER — Encounter: Payer: Self-pay | Admitting: Rehabilitative and Restorative Service Providers"

## 2022-08-15 ENCOUNTER — Ambulatory Visit (INDEPENDENT_AMBULATORY_CARE_PROVIDER_SITE_OTHER): Payer: 59 | Admitting: Rehabilitative and Restorative Service Providers"

## 2022-08-15 DIAGNOSIS — M25562 Pain in left knee: Secondary | ICD-10-CM | POA: Diagnosis not present

## 2022-08-15 DIAGNOSIS — M25561 Pain in right knee: Secondary | ICD-10-CM | POA: Diagnosis not present

## 2022-08-15 DIAGNOSIS — R262 Difficulty in walking, not elsewhere classified: Secondary | ICD-10-CM

## 2022-08-15 DIAGNOSIS — M25661 Stiffness of right knee, not elsewhere classified: Secondary | ICD-10-CM | POA: Diagnosis not present

## 2022-08-15 DIAGNOSIS — M25662 Stiffness of left knee, not elsewhere classified: Secondary | ICD-10-CM

## 2022-08-15 DIAGNOSIS — G8929 Other chronic pain: Secondary | ICD-10-CM
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
## 2022-08-19 ENCOUNTER — Encounter: Payer: 59 | Admitting: Rehabilitative and Restorative Service Providers"

## 2022-08-19 ENCOUNTER — Telehealth: Payer: Self-pay | Admitting: Rehabilitative and Restorative Service Providers"

## 2022-08-19 NOTE — Telephone Encounter (Signed)
Called patient after 15 mins no show for appointment today.  Left message and reminder of next appointment time, as well as call back number.   Scot Jun, PT, DPT, OCS, ATC 08/19/22  8:25 AM

## 2022-08-22 ENCOUNTER — Other Ambulatory Visit (HOSPITAL_COMMUNITY): Payer: Self-pay

## 2022-08-23 ENCOUNTER — Other Ambulatory Visit (HOSPITAL_COMMUNITY): Payer: Self-pay

## 2022-08-26 ENCOUNTER — Encounter: Payer: 59 | Admitting: Rehabilitative and Restorative Service Providers"

## 2022-08-28 ENCOUNTER — Encounter: Payer: Self-pay | Admitting: Rehabilitative and Restorative Service Providers"

## 2022-08-28 ENCOUNTER — Ambulatory Visit (INDEPENDENT_AMBULATORY_CARE_PROVIDER_SITE_OTHER): Payer: 59 | Admitting: Rehabilitative and Restorative Service Providers"

## 2022-08-28 DIAGNOSIS — M25662 Stiffness of left knee, not elsewhere classified: Secondary | ICD-10-CM | POA: Diagnosis not present

## 2022-08-28 DIAGNOSIS — M25661 Stiffness of right knee, not elsewhere classified: Secondary | ICD-10-CM

## 2022-08-28 DIAGNOSIS — M25562 Pain in left knee: Secondary | ICD-10-CM | POA: Diagnosis not present

## 2022-08-28 DIAGNOSIS — G8929 Other chronic pain: Secondary | ICD-10-CM

## 2022-08-28 DIAGNOSIS — M25561 Pain in right knee: Secondary | ICD-10-CM

## 2022-08-28 DIAGNOSIS — R262 Difficulty in walking, not elsewhere classified: Secondary | ICD-10-CM
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
## 2022-08-30 ENCOUNTER — Other Ambulatory Visit (HOSPITAL_BASED_OUTPATIENT_CLINIC_OR_DEPARTMENT_OTHER): Payer: Self-pay

## 2022-08-31 ENCOUNTER — Other Ambulatory Visit (HOSPITAL_COMMUNITY): Payer: Self-pay

## 2022-09-02 ENCOUNTER — Ambulatory Visit (INDEPENDENT_AMBULATORY_CARE_PROVIDER_SITE_OTHER): Payer: 59 | Admitting: Rehabilitative and Restorative Service Providers"

## 2022-09-02 ENCOUNTER — Encounter: Payer: Self-pay | Admitting: Rehabilitative and Restorative Service Providers"

## 2022-09-02 DIAGNOSIS — M25561 Pain in right knee: Secondary | ICD-10-CM

## 2022-09-02 DIAGNOSIS — M25562 Pain in left knee: Secondary | ICD-10-CM

## 2022-09-02 DIAGNOSIS — M25661 Stiffness of right knee, not elsewhere classified: Secondary | ICD-10-CM | POA: Diagnosis not present

## 2022-09-02 DIAGNOSIS — M25662 Stiffness of left knee, not elsewhere classified: Secondary | ICD-10-CM | POA: Diagnosis not present

## 2022-09-02 DIAGNOSIS — G8929 Other chronic pain: Secondary | ICD-10-CM

## 2022-09-02 DIAGNOSIS — R262 Difficulty in walking, not elsewhere classified: Secondary | ICD-10-CM
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
## 2022-09-04 ENCOUNTER — Ambulatory Visit (INDEPENDENT_AMBULATORY_CARE_PROVIDER_SITE_OTHER): Payer: 59 | Admitting: Rehabilitative and Restorative Service Providers"

## 2022-09-04 ENCOUNTER — Encounter: Payer: Self-pay | Admitting: Rehabilitative and Restorative Service Providers"

## 2022-09-04 DIAGNOSIS — M25662 Stiffness of left knee, not elsewhere classified: Secondary | ICD-10-CM | POA: Diagnosis not present

## 2022-09-04 DIAGNOSIS — M25562 Pain in left knee: Secondary | ICD-10-CM

## 2022-09-04 DIAGNOSIS — R262 Difficulty in walking, not elsewhere classified: Secondary | ICD-10-CM

## 2022-09-04 DIAGNOSIS — G8929 Other chronic pain: Secondary | ICD-10-CM

## 2022-09-04 DIAGNOSIS — M25661 Stiffness of right knee, not elsewhere classified: Secondary | ICD-10-CM

## 2022-09-04 DIAGNOSIS — M25561 Pain in right knee: Secondary | ICD-10-CM
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
## 2022-09-09 ENCOUNTER — Encounter: Payer: Self-pay | Admitting: Rehabilitative and Restorative Service Providers"

## 2022-09-09 ENCOUNTER — Ambulatory Visit (INDEPENDENT_AMBULATORY_CARE_PROVIDER_SITE_OTHER): Payer: 59 | Admitting: Rehabilitative and Restorative Service Providers"

## 2022-09-09 DIAGNOSIS — G8929 Other chronic pain: Secondary | ICD-10-CM

## 2022-09-09 DIAGNOSIS — M25662 Stiffness of left knee, not elsewhere classified: Secondary | ICD-10-CM | POA: Diagnosis not present

## 2022-09-09 DIAGNOSIS — M25561 Pain in right knee: Secondary | ICD-10-CM | POA: Diagnosis not present

## 2022-09-09 DIAGNOSIS — M25661 Stiffness of right knee, not elsewhere classified: Secondary | ICD-10-CM

## 2022-09-09 DIAGNOSIS — R262 Difficulty in walking, not elsewhere classified: Secondary | ICD-10-CM

## 2022-09-09 DIAGNOSIS — M25562 Pain in left knee: Secondary | ICD-10-CM | POA: Diagnosis not present
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
## 2022-09-11 ENCOUNTER — Encounter: Payer: 59 | Admitting: Rehabilitative and Restorative Service Providers"

## 2022-09-11 ENCOUNTER — Ambulatory Visit (HOSPITAL_BASED_OUTPATIENT_CLINIC_OR_DEPARTMENT_OTHER): Payer: 59 | Admitting: Nurse Practitioner

## 2022-09-12 ENCOUNTER — Encounter (HOSPITAL_BASED_OUTPATIENT_CLINIC_OR_DEPARTMENT_OTHER): Payer: Self-pay

## 2022-09-12 ENCOUNTER — Other Ambulatory Visit (HOSPITAL_BASED_OUTPATIENT_CLINIC_OR_DEPARTMENT_OTHER): Payer: Self-pay

## 2022-09-12 ENCOUNTER — Encounter (HOSPITAL_BASED_OUTPATIENT_CLINIC_OR_DEPARTMENT_OTHER): Payer: Self-pay | Admitting: Nurse Practitioner

## 2022-09-12 ENCOUNTER — Ambulatory Visit (HOSPITAL_BASED_OUTPATIENT_CLINIC_OR_DEPARTMENT_OTHER): Payer: 59 | Admitting: Nurse Practitioner

## 2022-09-12 DIAGNOSIS — Z0000 Encounter for general adult medical examination without abnormal findings: Secondary | ICD-10-CM

## 2022-09-16 ENCOUNTER — Ambulatory Visit (HOSPITAL_BASED_OUTPATIENT_CLINIC_OR_DEPARTMENT_OTHER): Payer: 59 | Admitting: Nurse Practitioner

## 2022-09-26 ENCOUNTER — Encounter: Payer: Self-pay | Admitting: Rehabilitative and Restorative Service Providers"

## 2022-09-26 ENCOUNTER — Ambulatory Visit (INDEPENDENT_AMBULATORY_CARE_PROVIDER_SITE_OTHER): Payer: 59 | Admitting: Rehabilitative and Restorative Service Providers"

## 2022-09-26 DIAGNOSIS — M25662 Stiffness of left knee, not elsewhere classified: Secondary | ICD-10-CM

## 2022-09-26 DIAGNOSIS — M25661 Stiffness of right knee, not elsewhere classified: Secondary | ICD-10-CM | POA: Diagnosis not present

## 2022-09-26 DIAGNOSIS — M25562 Pain in left knee: Secondary | ICD-10-CM | POA: Diagnosis not present

## 2022-09-26 DIAGNOSIS — M25561 Pain in right knee: Secondary | ICD-10-CM

## 2022-09-26 DIAGNOSIS — R262 Difficulty in walking, not elsewhere classified: Secondary | ICD-10-CM

## 2022-09-26 DIAGNOSIS — G8929 Other chronic pain: Secondary | ICD-10-CM
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
## 2022-10-01 ENCOUNTER — Encounter: Payer: Self-pay | Admitting: Rehabilitative and Restorative Service Providers"

## 2022-10-01 ENCOUNTER — Ambulatory Visit (INDEPENDENT_AMBULATORY_CARE_PROVIDER_SITE_OTHER): Payer: 59 | Admitting: Rehabilitative and Restorative Service Providers"

## 2022-10-01 DIAGNOSIS — M25562 Pain in left knee: Secondary | ICD-10-CM | POA: Diagnosis not present

## 2022-10-01 DIAGNOSIS — G8929 Other chronic pain: Secondary | ICD-10-CM

## 2022-10-01 DIAGNOSIS — R262 Difficulty in walking, not elsewhere classified: Secondary | ICD-10-CM

## 2022-10-01 DIAGNOSIS — M25561 Pain in right knee: Secondary | ICD-10-CM

## 2022-10-01 DIAGNOSIS — M25661 Stiffness of right knee, not elsewhere classified: Secondary | ICD-10-CM | POA: Diagnosis not present

## 2022-10-01 DIAGNOSIS — M25662 Stiffness of left knee, not elsewhere classified: Secondary | ICD-10-CM
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
## 2022-11-06 ENCOUNTER — Encounter: Payer: Self-pay | Admitting: Rehabilitative and Restorative Service Providers"

## 2022-11-06 ENCOUNTER — Ambulatory Visit (INDEPENDENT_AMBULATORY_CARE_PROVIDER_SITE_OTHER): Payer: 59 | Admitting: Rehabilitative and Restorative Service Providers"

## 2022-11-06 DIAGNOSIS — M25662 Stiffness of left knee, not elsewhere classified: Secondary | ICD-10-CM

## 2022-11-06 DIAGNOSIS — M25562 Pain in left knee: Secondary | ICD-10-CM | POA: Diagnosis not present

## 2022-11-06 DIAGNOSIS — M25561 Pain in right knee: Secondary | ICD-10-CM

## 2022-11-06 DIAGNOSIS — M25661 Stiffness of right knee, not elsewhere classified: Secondary | ICD-10-CM

## 2022-11-06 DIAGNOSIS — G8929 Other chronic pain: Secondary | ICD-10-CM

## 2022-11-06 DIAGNOSIS — R262 Difficulty in walking, not elsewhere classified: Secondary | ICD-10-CM
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
## 2022-11-20 ENCOUNTER — Encounter: Payer: 59 | Admitting: Rehabilitative and Restorative Service Providers"

## 2022-11-25 ENCOUNTER — Encounter: Payer: 59 | Admitting: Rehabilitative and Restorative Service Providers"

## 2022-12-02 ENCOUNTER — Encounter: Payer: 59 | Admitting: Rehabilitative and Restorative Service Providers"

## 2022-12-09 ENCOUNTER — Encounter: Payer: 59 | Admitting: Rehabilitative and Restorative Service Providers"

## 2022-12-18 ENCOUNTER — Ambulatory Visit (HOSPITAL_BASED_OUTPATIENT_CLINIC_OR_DEPARTMENT_OTHER): Payer: Managed Care, Other (non HMO) | Admitting: Orthopaedic Surgery

## 2022-12-18 ENCOUNTER — Encounter (HOSPITAL_BASED_OUTPATIENT_CLINIC_OR_DEPARTMENT_OTHER): Payer: Self-pay | Admitting: Orthopaedic Surgery

## 2022-12-18 DIAGNOSIS — M7542 Impingement syndrome of left shoulder: Secondary | ICD-10-CM

## 2022-12-18 DIAGNOSIS — S83511D Sprain of anterior cruciate ligament of right knee, subsequent encounter: Secondary | ICD-10-CM | POA: Diagnosis not present

## 2022-12-18 MED ORDER — LIDOCAINE HCL 1 % IJ SOLN
4.0000 mL | INTRAMUSCULAR | Status: AC | PRN
  Administered 2022-12-18: 4 mL

## 2022-12-18 MED ORDER — TRIAMCINOLONE ACETONIDE 40 MG/ML IJ SUSP
80.0000 mg | INTRAMUSCULAR | Status: AC | PRN
  Administered 2022-12-18: 80 mg via INTRA_ARTICULAR
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
## 2022-12-18 NOTE — Addendum Note (Signed)
Addended by: Raynelle Fanning A on: 12/18/2022 04:26 PM   Modules accepted: Orders

## 2022-12-19 ENCOUNTER — Encounter: Payer: Self-pay | Admitting: Nurse Practitioner

## 2022-12-19 ENCOUNTER — Ambulatory Visit (INDEPENDENT_AMBULATORY_CARE_PROVIDER_SITE_OTHER): Payer: Managed Care, Other (non HMO) | Admitting: Nurse Practitioner

## 2022-12-19 VITALS — BP 130/80 | HR 70 | Wt 206.2 lb

## 2022-12-19 DIAGNOSIS — S0300XA Dislocation of jaw, unspecified side, initial encounter: Secondary | ICD-10-CM

## 2022-12-19 DIAGNOSIS — G4452 New daily persistent headache (NDPH): Secondary | ICD-10-CM | POA: Insufficient documentation

## 2022-12-19 DIAGNOSIS — Z0000 Encounter for general adult medical examination without abnormal findings: Secondary | ICD-10-CM

## 2022-12-19 DIAGNOSIS — Z8042 Family history of malignant neoplasm of prostate: Secondary | ICD-10-CM | POA: Diagnosis not present

## 2022-12-19 DIAGNOSIS — R519 Headache, unspecified: Secondary | ICD-10-CM | POA: Insufficient documentation

## 2022-12-19 DIAGNOSIS — L732 Hidradenitis suppurativa: Secondary | ICD-10-CM

## 2022-12-19 DIAGNOSIS — Z800 Family history of malignant neoplasm of digestive organs: Secondary | ICD-10-CM

## 2022-12-19 LAB — CBC WITH DIFFERENTIAL/PLATELET

## 2022-12-19 MED ORDER — DOXYCYCLINE MONOHYDRATE 100 MG PO CAPS: 100.0000 mg | ORAL_CAPSULE | Freq: Two times a day (BID) | ORAL | 11 refills | Status: DC

## 2022-12-19 MED ORDER — RIZATRIPTAN BENZOATE 10 MG PO TABS: ORAL_TABLET | ORAL | 3 refills | Status: AC

## 2022-12-19 MED ORDER — METHOCARBAMOL 500 MG PO TABS: 500.0000 mg | ORAL_TABLET | Freq: Two times a day (BID) | ORAL | 1 refills | Status: DC

## 2022-12-19 NOTE — Assessment & Plan Note (Signed)
Chronic daily headache for approximately the past 2 months most likely related to muscle tension in the jaw and increased stress.  Headaches do have migraine-like symptoms with unilateral involvement, sensitivity to light and sound.  Will send in rizatriptan for management.  Discussion of how to take medication with patient today. Plan: Take rizatriptan at the first sign of headache and go to a dark quiet area to allow medication to work.  If you are not going to be driving in the near future you may also take a Flexeril if you do not have any relief of pain within 30 minutes. Follow-up if your symptoms worsen or new symptoms develop.

## 2022-12-19 NOTE — Patient Instructions (Signed)
You can take the methocarbamol for the jaw spasms- go ahead and take this for a couple of nights to help relax that muscle.   You can also take one of these with the rizatriptan for the headache.

## 2022-12-19 NOTE — Assessment & Plan Note (Signed)
Symptoms and presentation consistent with exacerbation of TMJ.  It is likely that clenching and grinding of the teeth are contributing to his daily headaches.  We discussed that the mouthguard should help reduce this with chronic use.  We also discussed that muscle tension can also contribute to his symptoms and the optional use of ibuprofen and Flexeril. Plan: Take Flexeril at bedtime for 3 nights. Wear mouthguard every night. Avoid foods that are chewing such as taffy, popcorn, gum. Avoid clenching the jaw when stressed. Seek follow-up with dentist for further recommendations.

## 2022-12-19 NOTE — Progress Notes (Signed)
Orma Render, DNP, AGNP-c [facility] [address] [place], [place] [address] [phone]  Subjective:   Jeffrey Bennett is a 37 y.o. male presents to day for evaluation of: Headache: Patient complains of headache. He does have a headache at this time. Headache is occurring daily for about the last 2 months. Starts between the eyes and radiates over left eye and to the top and side of the head. Also having pain in his left ear. Jaw pain and popping present. He has seen the dentist and they have fitted him for mouth guard to help with grinding his teeth at night. Pain is a 10/10 at it's worst. He will go to a dark room and put a cold towel over his head, which does help. He was using tylenol, but this stopped helping. He has not taken tylenol in about 3 weeks. HA last several hours with each episode. Nothing seems to be a particular trigger. He is under more stress with his job.  Hidradenitis: He is having recurrent flairs of HS in the axilla, groin, and buttock regions. He was taking the doxycycline but ran out. He would like to know if there are other treatment options for him.   PMH, Medications, and Allergies reviewed and updated in chart as appropriate.   ROS negative except for what is listed in HPI. Objective:  BP 130/80   Pulse 70   Wt 206 lb 3.2 oz (93.5 kg)   BMI 27.20 kg/m  Physical Exam Vitals and nursing note reviewed.  Constitutional:      General: He is not in acute distress.    Appearance: Normal appearance.  HENT:     Head: Normocephalic and atraumatic.     Jaw: Tenderness, swelling and pain on movement present. No trismus or malocclusion.     Right Ear: Tympanic membrane normal.     Left Ear: Tympanic membrane normal.     Nose: Nose normal.     Right Sinus: No maxillary sinus tenderness or frontal sinus tenderness.     Left Sinus: No maxillary sinus tenderness or frontal sinus tenderness.     Mouth/Throat:     Mouth: Mucous membranes are  moist.     Pharynx: Oropharynx is clear.  Eyes:     Extraocular Movements: Extraocular movements intact.     Pupils: Pupils are equal, round, and reactive to light.  Neck:     Vascular: No carotid bruit.  Cardiovascular:     Rate and Rhythm: Normal rate and regular rhythm.     Pulses: Normal pulses.     Heart sounds: Normal heart sounds.  Pulmonary:     Effort: Pulmonary effort is normal.     Breath sounds: Normal breath sounds.  Musculoskeletal:     Cervical back: Normal range of motion. No tenderness.  Lymphadenopathy:     Cervical: No cervical adenopathy.  Skin:    General: Skin is warm and dry.     Capillary Refill: Capillary refill takes less than 2 seconds.  Neurological:     General: No focal deficit present.     Mental Status: He is alert and oriented to person, place, and time.     Cranial Nerves: No cranial nerve deficit.     Motor: No weakness.     Gait: Gait normal.  Psychiatric:        Mood and Affect: Mood normal.        Behavior: Behavior normal.  Assessment & Plan:   Problem List Items Addressed This Visit     Hidradenitis suppurativa    Chronic issues with HS with repeated tunneling and drainage.  He has been on chronic doxycycline which has been effective in management for the most part however he recently ran out.  I did discuss with the patient that we can continue the chronic doxycycline but at this time it may be beneficial for him to visit dermatology as there are now other options including Biologics which may be helpful for his symptom management.  He would like a referral today. Plan: Start doxycycline back daily.  Referral has been placed for dermatology, they will call you to set up a schedule.  I sent this into [facility] dermatology which we have had a little more luck getting people in quicker.  Let me know if you have any concerns.      Relevant Medications   doxycycline (MONODOX) 100 MG capsule   Other Relevant Orders   Ambulatory  referral to Dermatology   TMJ (dislocation of temporomandibular joint), initial encounter - Primary    Symptoms and presentation consistent with exacerbation of TMJ.  It is likely that clenching and grinding of the teeth are contributing to his daily headaches.  We discussed that the mouthguard should help reduce this with chronic use.  We also discussed that muscle tension can also contribute to his symptoms and the optional use of ibuprofen and Flexeril. Plan: Take Flexeril at bedtime for 3 nights. Wear mouthguard every night. Avoid foods that are chewing such as taffy, popcorn, gum. Avoid clenching the jaw when stressed. Seek follow-up with dentist for further recommendations.      Relevant Medications   methocarbamol (ROBAXIN) 500 MG tablet   New persistent daily headache    Chronic daily headache for approximately the past 2 months most likely related to muscle tension in the jaw and increased stress.  Headaches do have migraine-like symptoms with unilateral involvement, sensitivity to light and sound.  Will send in rizatriptan for management.  Discussion of how to take medication with patient today. Plan: Take rizatriptan at the first sign of headache and go to a dark quiet area to allow medication to work.  If you are not going to be driving in the near future you may also take a Flexeril if you do not have any relief of pain within 30 minutes. Follow-up if your symptoms worsen or new symptoms develop.      Relevant Medications   methocarbamol (ROBAXIN) 500 MG tablet   rizatriptan (MAXALT) 10 MG tablet   Other Visit Diagnoses     Family history of prostate cancer in father       Relevant Orders   PSA Total (Reflex To Free)   Ambulatory referral to Gastroenterology   Family history of colon cancer in father       Relevant Orders   PSA Total (Reflex To Free)   Ambulatory referral to Gastroenterology   Health care maintenance       Relevant Orders   PSA Total (Reflex To Free)    CBC with Differential/Platelet   Comprehensive metabolic panel   Lipid panel   VITAMIN D 25 Hydroxy (Vit-D Deficiency, Fractures)         Orma Render, DNP, AGNP-c 12/19/2022  9:12 PM    History, Medications, Surgery, SDOH, and Family History reviewed and updated as appropriate.

## 2022-12-19 NOTE — Assessment & Plan Note (Signed)
Chronic issues with HS with repeated tunneling and drainage.  He has been on chronic doxycycline which has been effective in management for the most part however he recently ran out.  I did discuss with the patient that we can continue the chronic doxycycline but at this time it may be beneficial for him to visit dermatology as there are now other options including Biologics which may be helpful for his symptom management.  He would like a referral today. Plan: Start doxycycline back daily.  Referral has been placed for dermatology, they will call you to set up a schedule.  I sent this into [facility] dermatology which we have had a little more luck getting people in quicker.  Let me know if you have any concerns.

## 2022-12-20 LAB — CBC WITH DIFFERENTIAL/PLATELET
Basophils Absolute: 0 10*3/uL (ref 0.0–0.2)
Basos: 0 %
EOS (ABSOLUTE): 0 10*3/uL (ref 0.0–0.4)
Eos: 0 %
Hematocrit: 46.6 % (ref 37.5–51.0)
Hemoglobin: 15.6 g/dL (ref 13.0–17.7)
Immature Grans (Abs): 0 10*3/uL (ref 0.0–0.1)
Immature Granulocytes: 0 %
Lymphocytes Absolute: 1.8 10*3/uL (ref 0.7–3.1)
Lymphs: 19 %
MCH: 30.2 pg (ref 26.6–33.0)
MCHC: 33.5 g/dL (ref 31.5–35.7)
MCV: 90 fL (ref 79–97)
Monocytes Absolute: 0.7 10*3/uL (ref 0.1–0.9)
Monocytes: 8 %
Neutrophils Absolute: 6.6 10*3/uL (ref 1.4–7.0)
Neutrophils: 73 %
Platelets: 305 10*3/uL (ref 150–450)
RBC: 5.17 x10E6/uL (ref 4.14–5.80)
RDW: 13.9 % (ref 11.6–15.4)
WBC: 9.1 10*3/uL (ref 3.4–10.8)

## 2022-12-20 LAB — COMPREHENSIVE METABOLIC PANEL
ALT: 28 IU/L (ref 0–44)
AST: 14 IU/L (ref 0–40)
Albumin/Globulin Ratio: 2.3 — ABNORMAL HIGH (ref 1.2–2.2)
Albumin: 4.6 g/dL (ref 4.1–5.1)
Alkaline Phosphatase: 94 IU/L (ref 44–121)
BUN/Creatinine Ratio: 10 (ref 9–20)
BUN: 10 mg/dL (ref 6–20)
Bilirubin Total: 0.2 mg/dL (ref 0.0–1.2)
CO2: 19 mmol/L — ABNORMAL LOW (ref 20–29)
Calcium: 9.7 mg/dL (ref 8.7–10.2)
Chloride: 106 mmol/L (ref 96–106)
Creatinine, Ser: 0.97 mg/dL (ref 0.76–1.27)
Globulin, Total: 2 g/dL (ref 1.5–4.5)
Glucose: 68 mg/dL — ABNORMAL LOW (ref 70–99)
Potassium: 4.4 mmol/L (ref 3.5–5.2)
Sodium: 139 mmol/L (ref 134–144)
Total Protein: 6.6 g/dL (ref 6.0–8.5)
eGFR: 103 mL/min/{1.73_m2} (ref >59)

## 2022-12-20 LAB — PSA TOTAL (REFLEX TO FREE): Prostate Specific Ag, Serum: 0.2 ng/mL (ref 0.0–4.0)

## 2022-12-20 LAB — LIPID PANEL
Chol/HDL Ratio: 3.7 ratio (ref 0.0–5.0)
Cholesterol, Total: 242 mg/dL — ABNORMAL HIGH (ref 100–199)
HDL: 65 mg/dL (ref >39)
LDL Chol Calc (NIH): 161 mg/dL — ABNORMAL HIGH (ref 0–99)
Triglycerides: 90 mg/dL (ref 0–149)
VLDL Cholesterol Cal: 16 mg/dL (ref 5–40)

## 2022-12-20 LAB — VITAMIN D 25 HYDROXY (VIT D DEFICIENCY, FRACTURES): Vit D, 25-Hydroxy: 11 ng/mL — ABNORMAL LOW (ref 30.0–100.0)

## 2022-12-23 ENCOUNTER — Encounter (HOSPITAL_BASED_OUTPATIENT_CLINIC_OR_DEPARTMENT_OTHER): Payer: Self-pay | Admitting: Orthopaedic Surgery

## 2022-12-23 ENCOUNTER — Other Ambulatory Visit: Payer: Self-pay | Admitting: Nurse Practitioner

## 2022-12-23 DIAGNOSIS — E559 Vitamin D deficiency, unspecified: Secondary | ICD-10-CM

## 2022-12-23 DIAGNOSIS — E782 Mixed hyperlipidemia: Secondary | ICD-10-CM

## 2022-12-23 MED ORDER — VITAMIN D (ERGOCALCIFEROL) 1.25 MG (50000 UNIT) PO CAPS: 50000.0000 [IU] | ORAL_CAPSULE | ORAL | 1 refills | Status: AC

## 2022-12-25 ENCOUNTER — Ambulatory Visit (HOSPITAL_BASED_OUTPATIENT_CLINIC_OR_DEPARTMENT_OTHER): Payer: Self-pay | Admitting: Orthopaedic Surgery

## 2022-12-25 DIAGNOSIS — S83511A Sprain of anterior cruciate ligament of right knee, initial encounter: Secondary | ICD-10-CM

## 2022-12-25 NOTE — Progress Notes (Signed)
Surgical Instructions    Your procedure is scheduled on Monday February 12th.  Report to [facility] Main Entrance "A" at 10:15 A.M., then check in with the Admitting office.  Call this number if you have problems the morning of surgery:  [phone]   If you have any questions prior to your surgery date call [phone]: Open Monday-Friday 8am-4pm If you experience any cold or flu symptoms such as cough, fever, chills, shortness of breath, etc. between now and your scheduled surgery, please notify us at the above number     Remember:  Do not eat after midnight the night before your surgery  You may drink clear liquids until 9:15am the morning of your surgery.   Clear liquids allowed are: Water, Non-Citrus Juices (without pulp), Carbonated Beverages, Clear Tea, Black Coffee ONLY (NO MILK, CREAM OR POWDERED CREAMER of any kind), and Gatorade    Take these medicines the morning of surgery with A SIP OF WATER: doxycycline (MONODOX) 100 MG capsule  methocarbamol (ROBAXIN) 500 MG tablet    IF NEEDED  rizatriptan (MAXALT) 10 MG tablet  tetrahydrozoline 0.05 % ophthalmic solution    As of today, STOP taking any Aspirin (unless otherwise instructed by your surgeon) MOBIC, Aleve, Naproxen, Ibuprofen, Motrin, Advil, Goody's, BC's, all herbal medications, fish oil, and all vitamins.           Do not wear jewelry or makeup. Do not wear lotions, powders, perfumes or deodorant. Do not shave 48 hours prior to surgery.   Do not bring valuables to the hospital. Do not wear nail polish, gel polish, artificial nails, or any other type of covering on natural nails (fingers and toes) If you have artificial nails or gel coating that need to be removed by a nail salon, please have this removed prior to surgery. Artificial nails or gel coating may interfere with anesthesia's ability to adequately monitor your vital signs.  [facility] is not responsible for any belongings or valuables.    Do NOT  Smoke (Tobacco/Vaping)  24 hours prior to your procedure  If you use a CPAP at night, you may bring your mask for your overnight stay.   Contacts, glasses, hearing aids, dentures or partials may not be worn into surgery, please bring cases for these belongings   For patients admitted to the hospital, discharge time will be determined by your treatment team.   Patients discharged the day of surgery will not be allowed to drive home, and someone needs to stay with them for 24 hours.   SURGICAL WAITING ROOM VISITATION Patients having surgery or a procedure may have no more than 2 support people in the waiting area - these visitors may rotate.   Children under the age of 67 must have an adult with them who is not the patient. If the patient needs to stay at the hospital during part of their recovery, the visitor guidelines for inpatient rooms apply. Pre-op nurse will coordinate an appropriate time for 1 support person to accompany patient in pre-op.  This support person may not rotate.   Please refer to [email or web address].hu for the visitor guidelines for Inpatients (after your surgery is over and you are in a regular room).    Special instructions:    Oral Hygiene is also important to reduce your risk of infection.  Remember - BRUSH YOUR TEETH THE MORNING OF SURGERY WITH YOUR REGULAR TOOTHPASTE   [facility]- Preparing For Surgery  Before surgery, you can play an important role. Because skin  is not sterile, your skin needs to be as free of germs as possible. You can reduce the number of germs on your skin by washing with CHG (chlorahexidine gluconate) Soap before surgery.  CHG is an antiseptic cleaner which kills germs and bonds with the skin to continue killing germs even after washing.     Please do not use if you have an allergy to CHG or antibacterial soaps. If your skin becomes reddened/irritated stop using the CHG.  Do not shave  (including legs and underarms) for at least 48 hours prior to first CHG shower. It is OK to shave your face.  Please follow these instructions carefully.     Shower the NIGHT BEFORE SURGERY and the MORNING OF SURGERY with CHG Soap.   If you chose to wash your hair, wash your hair first as usual with your normal shampoo. After you shampoo, rinse your hair and body thoroughly to remove the shampoo.  Then [employer] and genitals (private parts) with your normal soap and rinse thoroughly to remove soap.  After that Use CHG Soap as you would any other liquid soap. You can apply CHG directly to the skin and wash gently with a scrungie or a clean washcloth.   Apply the CHG Soap to your body ONLY FROM THE NECK DOWN.  Do not use on open wounds or open sores. Avoid contact with your eyes, ears, mouth and genitals (private parts). Wash Face and genitals (private parts)  with your normal soap.   Wash thoroughly, paying special attention to the area where your surgery will be performed.  Thoroughly rinse your body with warm water from the neck down.  DO NOT shower/wash with your normal soap after using and rinsing off the CHG Soap.  Pat yourself dry with a CLEAN TOWEL.  Wear CLEAN PAJAMAS to bed the night before surgery  Place CLEAN SHEETS on your bed the night before your surgery  DO NOT SLEEP WITH PETS.   Day of Surgery:  Take a shower with CHG soap. Wear Clean/Comfortable clothing the morning of surgery Do not apply any deodorants/lotions.   Remember to brush your teeth WITH YOUR REGULAR TOOTHPASTE.    If you received a COVID test during your pre-op visit, it is requested that you wear a mask when out in public, stay away from anyone that may not be feeling well, and notify your surgeon if you develop symptoms. If you have been in contact with anyone that has tested positive in the last 10 days, please notify your surgeon.    Please read over the following fact sheets that you were  given.

## 2022-12-26 ENCOUNTER — Encounter (HOSPITAL_COMMUNITY): Payer: Self-pay

## 2022-12-26 ENCOUNTER — Other Ambulatory Visit: Payer: Self-pay

## 2022-12-26 ENCOUNTER — Encounter (HOSPITAL_COMMUNITY)
Admission: RE | Admit: 2022-12-26 | Discharge: 2022-12-26 | Disposition: A | Discharge status: Home / Self Care | Payer: Managed Care, Other (non HMO) | Source: Ambulatory Visit | Attending: Orthopaedic Surgery | Admitting: Orthopaedic Surgery

## 2022-12-26 DIAGNOSIS — Z01818 Encounter for other preprocedural examination: Secondary | ICD-10-CM | POA: Insufficient documentation

## 2022-12-26 HISTORY — DX: Gastro-esophageal reflux disease without esophagitis: K21.9

## 2022-12-26 HISTORY — DX: Unspecified osteoarthritis, unspecified site: M19.90

## 2022-12-26 NOTE — Progress Notes (Signed)
Surgical Instructions    Your procedure is scheduled on Monday February 12th.  Report to [facility] Main Entrance "A" at 10:15 A.M., then check in with the Admitting office.  Call this number if you have problems the morning of surgery:  [phone]   If you have any questions prior to your surgery date call [phone]: Open Monday-Friday 8am-4pm If you experience any cold or flu symptoms such as cough, fever, chills, shortness of breath, etc. between now and your scheduled surgery, please notify us at the above number     Remember:  Do not eat after midnight the night before your surgery  You may drink clear liquids until 9:15am the morning of your surgery.   Clear liquids allowed are: Water, Non-Citrus Juices (without pulp), Carbonated Beverages, Clear Tea, Black Coffee ONLY (NO MILK, CREAM OR POWDERED CREAMER of any kind), and Gatorade  Patient Instructions  The night before surgery:  No food after midnight. ONLY clear liquids after midnight  The day of surgery (if you do NOT have diabetes):  Drink ONE (1) Pre-Surgery Clear Ensure by 9:15AM the morning of surgery. Drink in one sitting. Do not sip.  This drink was given to you during your hospital  pre-op appointment visit.  Nothing else to drink after completing the  Pre-Surgery Clear Ensure.           If you have questions, please contact your surgeon's office.     Take these medicines the morning of surgery with A SIP OF WATER: doxycycline (MONODOX) 100 MG capsule  methocarbamol (ROBAXIN) 500 MG tablet    IF NEEDED  rizatriptan (MAXALT) 10 MG tablet  tetrahydrozoline 0.05 % ophthalmic solution    As of today, STOP taking any Aspirin (unless otherwise instructed by your surgeon) MOBIC, Aleve, Naproxen, Ibuprofen, Motrin, Advil, Goody's, BC's, all herbal medications, fish oil, and all vitamins.            [facility] is not responsible for any belongings or valuables.    Do NOT Smoke (Tobacco/Vaping)  24  hours prior to your procedure  If you use a CPAP at night, you may bring your mask for your overnight stay.   Contacts, glasses, hearing aids, dentures or partials may not be worn into surgery, please bring cases for these belongings   For patients admitted to the hospital, discharge time will be determined by your treatment team.   Patients discharged the day of surgery will not be allowed to drive home, and someone needs to stay with them for 24 hours.   SURGICAL WAITING ROOM VISITATION Patients having surgery or a procedure may have no more than 2 support people in the waiting area - these visitors may rotate.   Children under the age of 14 must have an adult with them who is not the patient. If the patient needs to stay at the hospital during part of their recovery, the visitor guidelines for inpatient rooms apply. Pre-op nurse will coordinate an appropriate time for 1 support person to accompany patient in pre-op.  This support person may not rotate.   Please refer to [email or web address] for the visitor guidelines for Inpatients (after your surgery is over and you are in a regular room).    Special instructions:    Oral Hygiene is also important to reduce your risk of infection.  Remember - BRUSH YOUR TEETH THE MORNING OF SURGERY WITH YOUR REGULAR TOOTHPASTE   [facility]- Preparing For Surgery  Before surgery, you can play an important  role. Because skin is not sterile, your skin needs to be as free of germs as possible. You can reduce the number of germs on your skin by washing with CHG (chlorahexidine gluconate) Soap before surgery.  CHG is an antiseptic cleaner which kills germs and bonds with the skin to continue killing germs even after washing.     Please do not use if you have an allergy to CHG or antibacterial soaps. If your skin becomes reddened/irritated stop using the CHG.  Do not shave (including legs and underarms)  for at least 48 hours prior to first CHG shower. It is OK to shave your face.  Please follow these instructions carefully.     Shower the NIGHT BEFORE SURGERY and the MORNING OF SURGERY with CHG Soap.   If you chose to wash your hair, wash your hair first as usual with your normal shampoo. After you shampoo, rinse your hair and body thoroughly to remove the shampoo.  Then [employer] and genitals (private parts) with your normal soap and rinse thoroughly to remove soap.  After that Use CHG Soap as you would any other liquid soap. You can apply CHG directly to the skin and wash gently with a scrungie or a clean washcloth.   Apply the CHG Soap to your body ONLY FROM THE NECK DOWN.  Do not use on open wounds or open sores. Avoid contact with your eyes, ears, mouth and genitals (private parts). Wash Face and genitals (private parts)  with your normal soap.   Wash thoroughly, paying special attention to the area where your surgery will be performed.  Thoroughly rinse your body with warm water from the neck down.  DO NOT shower/wash with your normal soap after using and rinsing off the CHG Soap.  Pat yourself dry with a CLEAN TOWEL.  Wear CLEAN PAJAMAS to bed the night before surgery  Place CLEAN SHEETS on your bed the night before your surgery  DO NOT SLEEP WITH PETS.   Day of Surgery:  Take a shower with CHG soap. Wear Clean/Comfortable clothing the morning of surgery Do not wear jewelry or makeup. Do not wear lotions, powders, perfumes or deodorant. Do not shave 48 hours prior to surgery.   Do not bring valuables to the hospital. Do not wear nail polish, gel polish, artificial nails, or any other type of covering on natural nails (fingers and toes) If you have artificial nails or gel coating that need to be removed by a nail salon, please have this removed prior to surgery. Artificial nails or gel coating may interfere with anesthesia's ability to adequately monitor your vital  signs. Remember to brush your teeth WITH YOUR REGULAR TOOTHPASTE.    If you received a COVID test during your pre-op visit, it is requested that you wear a mask when out in public, stay away from anyone that may not be feeling well, and notify your surgeon if you develop symptoms. If you have been in contact with anyone that has tested positive in the last 10 days, please notify your surgeon.    Please read over the following fact sheets that you were given.

## 2022-12-26 NOTE — Progress Notes (Signed)
PCP - Early, Coralee Pesa, NP  Cardiologist - denies  PPM/ICD - denies   Chest x-ray - N/A EKG - N/A Stress Test - denies ECHO - denies Cardiac Cath - denies  Sleep Study - denies   Fasting Blood Sugar - N/A   Last dose of GLP1 agonist-  N/A   Blood Thinner Instructions: N/A Aspirin Instructions:N/A  ERAS Protcol - ERAS PRE-SURGERY Ensure or G2-   COVID TEST- N/A   Anesthesia review: hx heart murmur, Karoline Caldwell PA to PAT room to listen to patient at appointment.   Patient denies shortness of breath, fever, cough and chest pain at PAT appointment   All instructions explained to the patient, with a verbal understanding of the material. Patient agrees to go over the instructions while at home for a better understanding. The opportunity to ask questions was provided.

## 2022-12-27 ENCOUNTER — Other Ambulatory Visit: Payer: Self-pay

## 2022-12-27 ENCOUNTER — Ambulatory Visit: Payer: Managed Care, Other (non HMO) | Admitting: Nurse Practitioner

## 2022-12-27 ENCOUNTER — Encounter: Payer: Self-pay | Admitting: Nurse Practitioner

## 2022-12-27 ENCOUNTER — Telehealth: Payer: Self-pay | Admitting: Nurse Practitioner

## 2022-12-27 VITALS — BP 128/82 | HR 68 | Wt 201.8 lb

## 2022-12-27 DIAGNOSIS — R220 Localized swelling, mass and lump, head: Secondary | ICD-10-CM | POA: Diagnosis not present

## 2022-12-27 DIAGNOSIS — K115 Sialolithiasis: Secondary | ICD-10-CM

## 2022-12-27 MED ORDER — HYDROCODONE-ACETAMINOPHEN 5-325 MG PO TABS: 1.0000 | ORAL_TABLET | Freq: Four times a day (QID) | ORAL | 0 refills | Status: DC | PRN

## 2022-12-27 MED ORDER — CEPHALEXIN 500 MG PO CAPS: 500.0000 mg | ORAL_CAPSULE | Freq: Three times a day (TID) | ORAL | 0 refills | Status: DC

## 2022-12-27 MED ORDER — HYDROCODONE-ACETAMINOPHEN 10-300 MG PO TABS: 1.0000 | ORAL_TABLET | Freq: Four times a day (QID) | ORAL | 0 refills | Status: DC | PRN

## 2022-12-27 NOTE — Assessment & Plan Note (Signed)
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 Today, the left side of the jaw is swollen, warm, and tender. Ear clear. Salivary gland is palpable. Salivary duct in cheek appears swollen with white debris in the opening. Manual massage and manipulation of the salivary duct and increased salivation with the use of candy facilitated removal of ductal stone and increased saliva production. Given the symptoms of warmth in the area, treatment with antibiotic therapy warranted. PLAN: - Prescription for antibiotic therapy with Keflex initiated. - Hydrocodone prescribed for pain management. - Patient counseled to communicate antibiotic use to the surgeon prior to the scheduled surgery on Monday. - Suggested use of sour candies to stimulate saliva production. - Patient instructed to perform daily salt water rinses until condition improves, followed by a reduction to once monthly. - RTC if swelling worsens or signs of worsening infection present, such as fever or redness.

## 2022-12-27 NOTE — Telephone Encounter (Signed)
[facility] called and they only have hydrocodone 10-325 instead of 10/300 and wanted to know if that was ok, if so they would need another prescriptions sent electronically.   [facility] [place], [place]

## 2022-12-27 NOTE — Telephone Encounter (Signed)
Pt went to pick up prescriptions and they were out of stock, he is asking if they can be sent to [facility].

## 2022-12-27 NOTE — Patient Instructions (Addendum)
I have sent in an antibiotic and pain medication for your jaw. Keep eating sour stuff all weekend to help the salivary gland continue to drain.   Swish in salt water to help.   Thank you for coming in for your visit today. It's great to see you. Based on our discussion and examination, I have provided a list of instructions for you to follow:  1. Labs and Exams: No additional labs or exams are needed at this time. 2. Behavior Changes: Consume sour candies to help with saliva flow and keep the salivary gland flushing. S 3. Medication:    a. I have prescribed an antibiotic, Keflex, to help with the salivary duct stone.    b. I have also prescribed a pain medication, Hydrocodone, to help manage your pain until the inflammation subsides.    c. Inform your surgeon about the antibiotic before your surgery on Monday.    d. Avoid taking vitamins and medications that could thin your blood before surgery. 4. Follow-ups: Please schedule a follow-up appointment if your symptoms do not improve or worsen.  In addition to these instructions, remember to swish salt water daily to help with the salivary duct stone. Once it's better, you can reduce the frequency to once a month.  Thank you again for your visit, and I wish you a speedy recovery. If you have any questions or concerns, please don't hesitate to reach out.

## 2022-12-27 NOTE — Assessment & Plan Note (Signed)
Swelling to the left side of the face at the area of the parotid gland with significant warmth and tenderness present on examination. Tenderness extends into the temple region and ear. Symptoms suggestive of parotid duct blockage, with possible infection.  PLAN: - antibiotic therapy with Keflex - warm compresses - salt water gargles daily - use of sour candies to promote salivation - Medication for pain - Follow-up if symptoms worsen or fail to improve.

## 2022-12-27 NOTE — Progress Notes (Signed)
Orma Render, DNP, AGNP-c [facility] [address] [place], [place] [address] [phone]  Subjective:   Jeffrey Bennett is a 37 y.o. male presents to day for evaluation of: Jaw Pain Zacharias reports persistent jaw pain that intensifies during eating. He describes the pain as bearable until they attempt to eat, at which point the discomfort becomes exacerbated. He reports his headache evaluated recently has since subsided. A previous pre-screening with another physician suggested the possibility of ear infections as a potential cause for the jaw pain.  During the clinical examination, the patient exhibits pain on the left side of the jaw and confirms tenderness in the same area. He reports a history of their jaw popping with every movement and a sensation of swelling in the temple area when eating, likening it to a feeling that their head might "explode."   He is currently preparing for a surgical procedure scheduled for the 27th. He has been instructed to refrain from taking any vitamins or medications that could potentially thin his blood prior to the surgery.   PMH, Medications, and Allergies reviewed and updated in chart as appropriate.   ROS negative except for what is listed in HPI. Objective:  BP 128/82   Pulse 68   Wt 201 lb 12.8 oz (91.5 kg)   BMI 26.62 kg/m  Physical Exam Vitals and nursing note reviewed.  Constitutional:      Appearance: Normal appearance.  HENT:     Head: Normocephalic.     Jaw: Tenderness, swelling and pain on movement present.     Salivary Glands: Right salivary gland is not diffusely enlarged or tender. Left salivary gland is diffusely enlarged and tender.     Right Ear: Hearing, tympanic membrane, ear canal and external ear normal.     Left Ear: Hearing, tympanic membrane, ear canal and external ear normal.     Nose: Nose normal.     Mouth/Throat:     Lips: Pink.     Mouth: Mucous membranes are moist.   Neck:      Vascular: No carotid bruit.  Cardiovascular:     Rate and Rhythm: Normal rate and regular rhythm.     Pulses: Normal pulses.  Pulmonary:     Effort: Pulmonary effort is normal.  Musculoskeletal:     Cervical back: Normal range of motion.  Lymphadenopathy:     Cervical: Cervical adenopathy present.  Skin:    General: Skin is warm and dry.     Capillary Refill: Capillary refill takes less than 2 seconds.  Neurological:     General: No focal deficit present.     Mental Status: He is alert and oriented to person, place, and time.  Psychiatric:        Mood and Affect: Mood normal.           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 Today, the left side of the jaw is swollen, warm, and tender. Ear clear. Salivary gland is palpable. Salivary duct in cheek appears swollen with white debris in the opening. Manual massage and manipulation of the salivary duct and increased salivation with the use of candy facilitated removal of ductal stone and increased saliva production. Given the symptoms of warmth in the area, treatment with antibiotic therapy warranted. PLAN: - Prescription for antibiotic therapy with Keflex initiated. - Hydrocodone prescribed for  pain management. - Patient counseled to communicate antibiotic use to the surgeon prior to the scheduled surgery on Monday. - Suggested use of sour candies to stimulate saliva production. - Patient instructed to perform daily salt water rinses until condition improves, followed by a reduction to once monthly. - RTC if swelling worsens or signs of worsening infection present, such as fever or redness.       Left facial swelling    Swelling to the left side of the face at the area of the parotid gland with significant warmth and tenderness present on examination. Tenderness extends into the temple region and ear. Symptoms  suggestive of parotid duct blockage, with possible infection.  PLAN: - antibiotic therapy with Keflex - warm compresses - salt water gargles daily - use of sour candies to promote salivation - Medication for pain - Follow-up if symptoms worsen or fail to improve.           Orma Render, DNP, AGNP-c 12/27/2022  5:59 PM    History, Medications, Surgery, SDOH, and Family History reviewed and updated as appropriate.

## 2022-12-30 ENCOUNTER — Ambulatory Visit (HOSPITAL_COMMUNITY): Payer: Managed Care, Other (non HMO) | Admitting: Physician Assistant

## 2022-12-30 ENCOUNTER — Other Ambulatory Visit: Payer: Self-pay

## 2022-12-30 ENCOUNTER — Encounter (HOSPITAL_COMMUNITY): Payer: Self-pay | Admitting: Orthopaedic Surgery

## 2022-12-30 ENCOUNTER — Encounter (HOSPITAL_COMMUNITY)
Admission: RE | Disposition: A | Discharge status: Home / Self Care | Payer: Self-pay | Source: Home / Self Care | Attending: Orthopaedic Surgery

## 2022-12-30 ENCOUNTER — Ambulatory Visit (HOSPITAL_COMMUNITY): Payer: Managed Care, Other (non HMO)

## 2022-12-30 ENCOUNTER — Other Ambulatory Visit (HOSPITAL_COMMUNITY): Payer: Self-pay

## 2022-12-30 ENCOUNTER — Ambulatory Visit (HOSPITAL_COMMUNITY)
Admission: RE | Admit: 2022-12-30 | Discharge: 2022-12-30 | Disposition: A | Discharge status: Home / Self Care | Payer: Managed Care, Other (non HMO) | Attending: Orthopaedic Surgery | Admitting: Orthopaedic Surgery

## 2022-12-30 DIAGNOSIS — X58XXXA Exposure to other specified factors, initial encounter: Secondary | ICD-10-CM | POA: Diagnosis not present

## 2022-12-30 DIAGNOSIS — K219 Gastro-esophageal reflux disease without esophagitis: Secondary | ICD-10-CM | POA: Diagnosis not present

## 2022-12-30 DIAGNOSIS — S83511A Sprain of anterior cruciate ligament of right knee, initial encounter: Secondary | ICD-10-CM

## 2022-12-30 DIAGNOSIS — M1990 Unspecified osteoarthritis, unspecified site: Secondary | ICD-10-CM | POA: Insufficient documentation

## 2022-12-30 DIAGNOSIS — Z87891 Personal history of nicotine dependence: Secondary | ICD-10-CM | POA: Diagnosis not present

## 2022-12-30 DIAGNOSIS — R519 Headache, unspecified: Secondary | ICD-10-CM | POA: Insufficient documentation

## 2022-12-30 HISTORY — PX: KNEE ARTHROSCOPY WITH ANTERIOR CRUCIATE LIGAMENT (ACL) REPAIR: SHX5644

## 2022-12-30 SURGERY — KNEE ARTHROSCOPY WITH ANTERIOR CRUCIATE LIGAMENT (ACL) REPAIR
Anesthesia: General | Site: Knee | Laterality: Right

## 2022-12-30 MED ORDER — MIDAZOLAM HCL 2 MG/2ML IJ SOLN
1.0000 mg | Freq: Once | INTRAMUSCULAR | Status: AC
  Administered 2022-12-30: 1 mg via INTRAVENOUS

## 2022-12-30 MED ORDER — VANCOMYCIN HCL 1000 MG IV SOLR
INTRAVENOUS | Status: AC
  Filled 2022-12-30: qty 20

## 2022-12-30 MED ORDER — CHLORHEXIDINE GLUCONATE 0.12 % MT SOLN
OROMUCOSAL | Status: AC
  Administered 2022-12-30: 15 mL
  Filled 2022-12-30: qty 15

## 2022-12-30 MED ORDER — ACETAMINOPHEN 500 MG PO TABS
1000.0000 mg | ORAL_TABLET | Freq: Once | ORAL | Status: AC
  Administered 2022-12-30: 1000 mg via ORAL
  Filled 2022-12-30: qty 2

## 2022-12-30 MED ORDER — MIDAZOLAM HCL 2 MG/2ML IJ SOLN
INTRAMUSCULAR | Status: DC | PRN
  Administered 2022-12-30: 2 mg via INTRAVENOUS

## 2022-12-30 MED ORDER — LACTATED RINGERS IV SOLN: INTRAVENOUS | Status: DC | PRN

## 2022-12-30 MED ORDER — OXYCODONE HCL 5 MG PO TABS
5.0000 mg | ORAL_TABLET | ORAL | 0 refills | Status: DC | PRN
  Filled 2022-12-30: qty 30, 5d supply, fill #0

## 2022-12-30 MED ORDER — HYDROMORPHONE HCL 1 MG/ML IJ SOLN
0.2500 mg | INTRAMUSCULAR | Status: DC | PRN
  Administered 2022-12-30 (×4): 0.5 mg via INTRAVENOUS

## 2022-12-30 MED ORDER — CEFAZOLIN SODIUM-DEXTROSE 2-4 GM/100ML-% IV SOLN
2.0000 g | INTRAVENOUS | Status: AC
  Administered 2022-12-30: 2 g via INTRAVENOUS
  Filled 2022-12-30: qty 100

## 2022-12-30 MED ORDER — HYDROMORPHONE HCL 1 MG/ML IJ SOLN
INTRAMUSCULAR | Status: AC
  Filled 2022-12-30: qty 1

## 2022-12-30 MED ORDER — KETOROLAC TROMETHAMINE 30 MG/ML IJ SOLN
30.0000 mg | Freq: Once | INTRAMUSCULAR | Status: AC
  Administered 2022-12-30: 30 mg via INTRAVENOUS

## 2022-12-30 MED ORDER — MIDAZOLAM HCL 2 MG/2ML IJ SOLN
INTRAMUSCULAR | Status: AC
  Filled 2022-12-30: qty 2

## 2022-12-30 MED ORDER — DEXAMETHASONE SODIUM PHOSPHATE 10 MG/ML IJ SOLN
INTRAMUSCULAR | Status: DC | PRN
  Administered 2022-12-30: 10 mg via INTRAVENOUS

## 2022-12-30 MED ORDER — FENTANYL CITRATE (PF) 100 MCG/2ML IJ SOLN
INTRAMUSCULAR | Status: AC
  Filled 2022-12-30: qty 2

## 2022-12-30 MED ORDER — DEXAMETHASONE SODIUM PHOSPHATE 10 MG/ML IJ SOLN
INTRAMUSCULAR | Status: AC
  Filled 2022-12-30: qty 1

## 2022-12-30 MED ORDER — OXYCODONE HCL 5 MG PO TABS
ORAL_TABLET | ORAL | Status: AC
  Filled 2022-12-30: qty 1

## 2022-12-30 MED ORDER — OXYCODONE HCL 5 MG PO TABS
5.0000 mg | ORAL_TABLET | Freq: Once | ORAL | Status: AC | PRN
  Administered 2022-12-30: 5 mg via ORAL

## 2022-12-30 MED ORDER — 0.9 % SODIUM CHLORIDE (POUR BTL) OPTIME
TOPICAL | Status: DC | PRN
  Administered 2022-12-30: 1000 mL

## 2022-12-30 MED ORDER — FENTANYL CITRATE (PF) 250 MCG/5ML IJ SOLN
INTRAMUSCULAR | Status: DC | PRN
  Administered 2022-12-30 (×5): 50 ug via INTRAVENOUS

## 2022-12-30 MED ORDER — BUPIVACAINE HCL (PF) 0.25 % IJ SOLN
INTRAMUSCULAR | Status: AC
  Filled 2022-12-30: qty 60

## 2022-12-30 MED ORDER — ACETAMINOPHEN 10 MG/ML IV SOLN
INTRAVENOUS | Status: AC
  Filled 2022-12-30: qty 100

## 2022-12-30 MED ORDER — SODIUM CHLORIDE 0.9 % IR SOLN
Status: DC | PRN
  Administered 2022-12-30 (×2): 3001 mL

## 2022-12-30 MED ORDER — MIDAZOLAM HCL 2 MG/2ML IJ SOLN
INTRAMUSCULAR | Status: AC
  Administered 2022-12-30: 2 mg via INTRAVENOUS
  Filled 2022-12-30: qty 2

## 2022-12-30 MED ORDER — LIDOCAINE 2% (20 MG/ML) 5 ML SYRINGE
INTRAMUSCULAR | Status: AC
  Filled 2022-12-30: qty 5

## 2022-12-30 MED ORDER — FENTANYL CITRATE (PF) 100 MCG/2ML IJ SOLN
25.0000 ug | INTRAMUSCULAR | Status: DC | PRN
  Administered 2022-12-30 (×2): 50 ug via INTRAVENOUS

## 2022-12-30 MED ORDER — ONDANSETRON HCL 4 MG/2ML IJ SOLN: 4.0000 mg | Freq: Once | INTRAMUSCULAR | Status: DC | PRN

## 2022-12-30 MED ORDER — IBUPROFEN 800 MG PO TABS
800.0000 mg | ORAL_TABLET | Freq: Three times a day (TID) | ORAL | 0 refills | Status: AC
  Filled 2022-12-30: qty 30, 10d supply, fill #0

## 2022-12-30 MED ORDER — SODIUM CHLORIDE 0.9 % IR SOLN
Status: DC | PRN
  Administered 2022-12-30 (×2): 3000 mL

## 2022-12-30 MED ORDER — ACETAMINOPHEN 10 MG/ML IV SOLN
1000.0000 mg | Freq: Once | INTRAVENOUS | Status: AC
  Administered 2022-12-30: 1000 mg via INTRAVENOUS

## 2022-12-30 MED ORDER — LIDOCAINE 2% (20 MG/ML) 5 ML SYRINGE
INTRAMUSCULAR | Status: DC | PRN
  Administered 2022-12-30: 100 mg via INTRAVENOUS

## 2022-12-30 MED ORDER — ROPIVACAINE HCL 5 MG/ML IJ SOLN
INTRAMUSCULAR | Status: DC | PRN
  Administered 2022-12-30: 20 mL via PERINEURAL

## 2022-12-30 MED ORDER — GABAPENTIN 300 MG PO CAPS
300.0000 mg | ORAL_CAPSULE | Freq: Once | ORAL | Status: AC
  Administered 2022-12-30: 300 mg via ORAL
  Filled 2022-12-30: qty 1

## 2022-12-30 MED ORDER — ASPIRIN 325 MG PO TBEC
325.0000 mg | DELAYED_RELEASE_TABLET | Freq: Every day | ORAL | 0 refills | Status: DC
  Filled 2022-12-30: qty 30, 30d supply, fill #0

## 2022-12-30 MED ORDER — ONDANSETRON HCL 4 MG/2ML IJ SOLN
INTRAMUSCULAR | Status: AC
  Filled 2022-12-30: qty 2

## 2022-12-30 MED ORDER — ACETAMINOPHEN 500 MG PO TABS
500.0000 mg | ORAL_TABLET | Freq: Three times a day (TID) | ORAL | 0 refills | Status: AC
  Filled 2022-12-30: qty 30, 10d supply, fill #0

## 2022-12-30 MED ORDER — EPINEPHRINE PF 1 MG/ML IJ SOLN
INTRAMUSCULAR | Status: AC
  Filled 2022-12-30: qty 4

## 2022-12-30 MED ORDER — ONDANSETRON HCL 4 MG/2ML IJ SOLN
INTRAMUSCULAR | Status: DC | PRN
  Administered 2022-12-30: 4 mg via INTRAVENOUS

## 2022-12-30 MED ORDER — PROPOFOL 10 MG/ML IV BOLUS
INTRAVENOUS | Status: AC
  Filled 2022-12-30: qty 20

## 2022-12-30 MED ORDER — ACETAMINOPHEN 160 MG/5ML PO SOLN: 325.0000 mg | ORAL | Status: DC | PRN

## 2022-12-30 MED ORDER — VANCOMYCIN HCL 1000 MG IV SOLR
INTRAVENOUS | Status: DC | PRN
  Administered 2022-12-30: 1000 mg via TOPICAL

## 2022-12-30 MED ORDER — EPHEDRINE SULFATE-NACL 50-0.9 MG/10ML-% IV SOSY
PREFILLED_SYRINGE | INTRAVENOUS | Status: DC | PRN
  Administered 2022-12-30 (×4): 5 mg via INTRAVENOUS

## 2022-12-30 MED ORDER — HYDROMORPHONE HCL 1 MG/ML IJ SOLN
0.5000 mg | INTRAMUSCULAR | Status: AC
  Administered 2022-12-30 (×2): 0.5 mg via INTRAVENOUS

## 2022-12-30 MED ORDER — PROPOFOL 10 MG/ML IV BOLUS
INTRAVENOUS | Status: DC | PRN
  Administered 2022-12-30: 200 mg via INTRAVENOUS

## 2022-12-30 MED ORDER — TRANEXAMIC ACID-NACL 1000-0.7 MG/100ML-% IV SOLN
1000.0000 mg | INTRAVENOUS | Status: AC
  Administered 2022-12-30: 1000 mg via INTRAVENOUS
  Filled 2022-12-30: qty 100

## 2022-12-30 MED ORDER — FENTANYL CITRATE (PF) 100 MCG/2ML IJ SOLN: 100.0000 ug | Freq: Once | INTRAMUSCULAR | Status: AC

## 2022-12-30 MED ORDER — MEPERIDINE HCL 25 MG/ML IJ SOLN: 6.2500 mg | INTRAMUSCULAR | Status: DC | PRN

## 2022-12-30 MED ORDER — KETOROLAC TROMETHAMINE 30 MG/ML IJ SOLN
INTRAMUSCULAR | Status: AC
  Filled 2022-12-30: qty 1

## 2022-12-30 MED ORDER — OXYCODONE HCL 5 MG/5ML PO SOLN: 5.0000 mg | Freq: Once | ORAL | Status: AC | PRN

## 2022-12-30 MED ORDER — FENTANYL CITRATE (PF) 100 MCG/2ML IJ SOLN
INTRAMUSCULAR | Status: AC
  Administered 2022-12-30: 100 ug via INTRAVENOUS
  Filled 2022-12-30: qty 2

## 2022-12-30 MED ORDER — EPHEDRINE 5 MG/ML INJ
INTRAVENOUS | Status: AC
  Filled 2022-12-30: qty 5

## 2022-12-30 MED ORDER — MIDAZOLAM HCL 2 MG/2ML IJ SOLN: 2.0000 mg | Freq: Once | INTRAMUSCULAR | Status: DC

## 2022-12-30 MED ORDER — ACETAMINOPHEN 325 MG PO TABS: 325.0000 mg | ORAL_TABLET | ORAL | Status: DC | PRN

## 2022-12-30 MED ORDER — FENTANYL CITRATE (PF) 250 MCG/5ML IJ SOLN
INTRAMUSCULAR | Status: AC
  Filled 2022-12-30: qty 5

## 2022-12-30 MED ORDER — MIDAZOLAM HCL 2 MG/2ML IJ SOLN: 2.0000 mg | Freq: Once | INTRAMUSCULAR | Status: AC

## 2022-12-30 MED ORDER — DEXMEDETOMIDINE HCL IN NACL 80 MCG/20ML IV SOLN
INTRAVENOUS | Status: DC | PRN
  Administered 2022-12-30 (×2): 8 ug via BUCCAL

## 2022-12-30 SURGICAL SUPPLY — 93 items
ALCOHOL 70% 16 OZ (MISCELLANEOUS) ×1 IMPLANT
ANCHOR BUTTON TIGHTROPE 14 (Anchor) IMPLANT
BAG COUNTER SPONGE SURGICOUNT (BAG) IMPLANT
BANDAGE ESMARK 6X9 LF (GAUZE/BANDAGES/DRESSINGS) IMPLANT
BLADE EXCALIBUR 4.0X13 (MISCELLANEOUS) ×1 IMPLANT
BLADE SURG 10 STRL SS (BLADE) IMPLANT
BLADE SURG 15 STRL LF DISP TIS (BLADE) ×2 IMPLANT
BLADE SURG 15 STRL SS (BLADE) ×2
BNDG ELASTIC 6X15 VLCR STRL LF (GAUZE/BANDAGES/DRESSINGS) ×1 IMPLANT
BNDG ESMARK 6X9 LF (GAUZE/BANDAGES/DRESSINGS)
CHLORAPREP W/TINT 26 (MISCELLANEOUS) ×2 IMPLANT
COOLER ICEMAN CLASSIC (MISCELLANEOUS) ×1 IMPLANT
COVER MAYO STAND STRL (DRAPES) ×1 IMPLANT
COVER SURGICAL LIGHT HANDLE (MISCELLANEOUS) ×1 IMPLANT
CUFF TOURN SGL QUICK 34 (TOURNIQUET CUFF)
CUFF TOURN SGL QUICK 42 (TOURNIQUET CUFF) IMPLANT
CUFF TRNQT CYL 34X4.125X (TOURNIQUET CUFF) IMPLANT
DRAPE ARTHROSCOPY W/POUCH 114 (DRAPES) ×1 IMPLANT
DRAPE INCISE IOBAN 66X45 STRL (DRAPES) ×1 IMPLANT
DRAPE OEC MINIVIEW 54X84 (DRAPES) IMPLANT
DRAPE ORTHO SPLIT 77X108 STRL (DRAPES) ×1
DRAPE SURG ORHT 6 SPLT 77X108 (DRAPES) ×1 IMPLANT
DRAPE U-SHAPE 47X51 STRL (DRAPES) ×1 IMPLANT
DRSG TEGADERM 4X4.75 (GAUZE/BANDAGES/DRESSINGS) ×4 IMPLANT
DRSG TELFA 3X8 NADH STRL (GAUZE/BANDAGES/DRESSINGS) ×2 IMPLANT
DW OUTFLOW CASSETTE/TUBE SET (MISCELLANEOUS) IMPLANT
ELECT REM PT RETURN 9FT ADLT (ELECTROSURGICAL) ×1
ELECTRODE REM PT RTRN 9FT ADLT (ELECTROSURGICAL) ×1 IMPLANT
EXCALIBUR 3.8MM X 13CM (MISCELLANEOUS) ×1 IMPLANT
GAUZE PAD ABD 8X10 STRL (GAUZE/BANDAGES/DRESSINGS) ×1 IMPLANT
GAUZE SPONGE 4X4 12PLY STRL (GAUZE/BANDAGES/DRESSINGS) IMPLANT
GAUZE SPONGE 4X4 12PLY STRL LF (GAUZE/BANDAGES/DRESSINGS) ×1 IMPLANT
GAUZE XEROFORM 1X8 LF (GAUZE/BANDAGES/DRESSINGS) ×2 IMPLANT
GLOVE BIOGEL PI IND STRL 6.5 (GLOVE) ×1 IMPLANT
GLOVE BIOGEL PI IND STRL 8 (GLOVE) ×1 IMPLANT
GLOVE ECLIPSE 6.0 STRL STRAW (GLOVE) ×1 IMPLANT
GLOVE INDICATOR 8.0 STRL GRN (GLOVE) ×1 IMPLANT
GOWN STRL REUS W/ TWL LRG LVL3 (GOWN DISPOSABLE) ×2 IMPLANT
GOWN STRL REUS W/TWL LRG LVL3 (GOWN DISPOSABLE) ×2
IMMOBILIZER KNEE 22 UNIV (SOFTGOODS) ×1 IMPLANT
IMPL QUADLINK SYSTEM 10 (Orthopedic Implant) IMPLANT
IMPLANT QUADLINK SYSTEM 10 (Orthopedic Implant) ×1 IMPLANT
KIT BASIN OR (CUSTOM PROCEDURE TRAY) ×1 IMPLANT
KIT TRANSTIBIAL (DISPOSABLE) IMPLANT
KIT TURNOVER KIT B (KITS) ×1 IMPLANT
KNIFE GRAFT ACL 9MM (MISCELLANEOUS) IMPLANT
MANIFOLD NEPTUNE II (INSTRUMENTS) ×1 IMPLANT
NDL 18GX1X1/2 (RX/OR ONLY) (NEEDLE) ×1 IMPLANT
NDL HYPO 18GX1.5 BLUNT FILL (NEEDLE) ×1 IMPLANT
NDL SUT 2-0 SCORPION KNEE (NEEDLE) IMPLANT
NEEDLE 18GX1X1/2 (RX/OR ONLY) (NEEDLE) ×1 IMPLANT
NEEDLE HYPO 18GX1.5 BLUNT FILL (NEEDLE) ×1 IMPLANT
NEEDLE SUT 2-0 SCORPION KNEE (NEEDLE) ×1 IMPLANT
NS IRRIG 1000ML POUR BTL (IV SOLUTION) ×1 IMPLANT
PACK ARTHROSCOPY DSU (CUSTOM PROCEDURE TRAY) ×1 IMPLANT
PAD ARMBOARD 7.5X6 YLW CONV (MISCELLANEOUS) ×2 IMPLANT
PAD CAST 4YDX4 CTTN HI CHSV (CAST SUPPLIES) ×1 IMPLANT
PAD COLD SHLDR WRAP-ON (PAD) ×1 IMPLANT
PADDING CAST COTTON 4X4 STRL (CAST SUPPLIES)
PADDING CAST COTTON 6X4 STRL (CAST SUPPLIES) ×2 IMPLANT
PENCIL BUTTON HOLSTER BLD 10FT (ELECTRODE) ×1 IMPLANT
PROBE APOLLO 90XL (SURGICAL WAND) IMPLANT
SPIKE FLUID TRANSFER (MISCELLANEOUS) ×1 IMPLANT
SPONGE T-LAP 18X18 ~~LOC~~+RFID (SPONGE) ×1 IMPLANT
SPONGE T-LAP 4X18 ~~LOC~~+RFID (SPONGE) ×2 IMPLANT
STRIP CLOSURE SKIN 1/2X4 (GAUZE/BANDAGES/DRESSINGS) ×2 IMPLANT
SUCTION FRAZIER HANDLE 10FR (MISCELLANEOUS) ×1
SUCTION TUBE FRAZIER 10FR DISP (MISCELLANEOUS) ×1 IMPLANT
SUT ETHILON 3 0 PS 1 (SUTURE) ×2 IMPLANT
SUT MNCRL AB 3-0 PS2 18 (SUTURE) ×1 IMPLANT
SUT SILK 0 TIES 10X30 (SUTURE) IMPLANT
SUT VIC AB 0 CT1 27 (SUTURE) ×4
SUT VIC AB 0 CT1 27XBRD ANBCTR (SUTURE) ×1 IMPLANT
SUT VIC AB 1 CTX 36 (SUTURE) ×1
SUT VIC AB 1 CTX36XBRD ANBCTRL (SUTURE) IMPLANT
SUT VIC AB 2-0 CT1 27 (SUTURE) ×1
SUT VIC AB 2-0 CT1 TAPERPNT 27 (SUTURE) ×1 IMPLANT
SUT VICRYL 0 UR6 27IN ABS (SUTURE) ×1 IMPLANT
SUTURE TAPE 1.3 FIBERLOP 20 ST (SUTURE) IMPLANT
SUTURE TAPE TIGERLINK 1.3MM BL (SUTURE) IMPLANT
SUTURETAPE 1.3 FIBERLOOP 20 ST (SUTURE) ×1
SUTURETAPE TIGERLINK 1.3MM BL (SUTURE) ×1
SYR 30ML LL (SYRINGE) ×1 IMPLANT
SYR 3ML LL SCALE MARK (SYRINGE) ×1 IMPLANT
SYR BULB IRRIG 60ML STRL (SYRINGE) ×1 IMPLANT
SYR TB 1ML LUER SLIP (SYRINGE) ×1 IMPLANT
SYSTEM IMPL ACL/PCL SWIVILLOCK (Anchor) IMPLANT
TOWEL GREEN STERILE (TOWEL DISPOSABLE) ×1 IMPLANT
TOWEL GREEN STERILE FF (TOWEL DISPOSABLE) ×1 IMPLANT
TUBING ARTHROSCOPY IRRIG 16FT (MISCELLANEOUS) ×1 IMPLANT
UNDERPAD 30X36 HEAVY ABSORB (UNDERPADS AND DIAPERS) ×1 IMPLANT
WRAP KNEE MAXI GEL POST OP (GAUZE/BANDAGES/DRESSINGS) ×1 IMPLANT
YANKAUER SUCT BULB TIP NO VENT (SUCTIONS) ×1 IMPLANT

## 2022-12-30 NOTE — Progress Notes (Addendum)
Orthopedic Tech Progress Note Patient Details:  Jejuan Town [birth year] [id number]  PACU RN called requesting a pair of CRUTCHES    Ortho Devices Type of Ortho Device: Crutches Ortho Device/Splint Interventions: Ordered, Application, Adjustment   Post Interventions Patient Tolerated: Well, Ambulated well Instructions Provided: Poper ambulation with device, Care of device  Janit Pagan 12/30/2022, 5:07 PM

## 2022-12-30 NOTE — Anesthesia Preprocedure Evaluation (Addendum)
Anesthesia Evaluation  Patient identified by MRN, date of birth, ID band Patient awake    Reviewed: Allergy & Precautions, H&P , NPO status , Patient's Chart, lab work & pertinent test results  Airway Mallampati: II  TM Distance: >3 FB Neck ROM: Full    Dental no notable dental hx. (+) Teeth Intact, Dental Advisory Given   Pulmonary neg pulmonary ROS, Patient abstained from smoking., former smoker   Pulmonary exam normal breath sounds clear to auscultation       Cardiovascular Exercise Tolerance: Good negative cardio ROS Normal cardiovascular exam+ Valvular Problems/Murmurs  Rhythm:Regular Rate:Normal     Neuro/Psych  Headaches negative neurological ROS  negative psych ROS   GI/Hepatic negative GI ROS, Neg liver ROS,GERD  Medicated,,  Endo/Other  negative endocrine ROS    Renal/GU negative Renal ROS  negative genitourinary   Musculoskeletal negative musculoskeletal ROS (+) Arthritis , Osteoarthritis,    Abdominal   Peds negative pediatric ROS (+)  Hematology negative hematology ROS (+)   Anesthesia Other Findings   Reproductive/Obstetrics negative OB ROS                             Anesthesia Physical Anesthesia Plan  ASA: 2  Anesthesia Plan: General and Regional   Post-op Pain Management: Regional block* and Minimal or no pain anticipated   Induction: Intravenous  PONV Risk Score and Plan: 2 and Ondansetron, Dexamethasone and Midazolam  Airway Management Planned: LMA  Additional Equipment: None  Intra-op Plan:   Post-operative Plan: Extubation in OR  Informed Consent: I have reviewed the patients History and Physical, chart, labs and discussed the procedure including the risks, benefits and alternatives for the proposed anesthesia with the patient or authorized representative who has indicated his/her understanding and acceptance.     Dental advisory given  Plan  Discussed with: CRNA and Anesthesiologist  Anesthesia Plan Comments: ( )        Anesthesia Quick Evaluation

## 2022-12-30 NOTE — Anesthesia Procedure Notes (Signed)
Procedure Name: LMA Insertion Date/Time: 12/30/2022 12:29 PM  Performed by: Michele Rockers, CRNAPre-anesthesia Checklist: Patient identified, Emergency Drugs available, Suction available and Patient being monitored Patient Re-evaluated:Patient Re-evaluated prior to induction Oxygen Delivery Method: Circle system utilized Preoxygenation: Pre-oxygenation with 100% oxygen Induction Type: IV induction Ventilation: Mask ventilation without difficulty LMA: LMA flexible inserted LMA Size: 5.0 Number of attempts: 1 Placement Confirmation: positive ETCO2 and breath sounds checked- equal and bilateral Tube secured with: Tape Dental Injury: Teeth and Oropharynx as per pre-operative assessment

## 2022-12-30 NOTE — Discharge Instructions (Signed)
     Discharge Instructions    Attending Surgeon: Vanetta Mulders, MD Office Phone Number: [phone]   Diagnosis and Procedures:    Surgeries Performed: Right knee ACL reconstruction  Discharge Plan:    Diet: Resume usual diet. Begin with light or bland foods.  Drink plenty of fluids.  Activity:  Weight bearing as tolerated right leg with brace straight. You are advised to go home directly from the hospital or surgical center. Restrict your activities.  GENERAL INSTRUCTIONS: 1.  Please apply ice to your wound to help with swelling and inflammation. This will improve your comfort and your overall recovery following surgery.     2. Please call Dr. Eddie Dibbles office at [phone] with questions Monday-Friday during business hours. If no one answers, please leave a message and someone should get back to the patient within 24 hours. For emergencies please call 911 or proceed to the emergency room.   3. Patient to notify surgical team if experiences any of the following: Bowel/Bladder dysfunction, uncontrolled pain, nerve/muscle weakness, incision with increased drainage or redness, nausea/vomiting and Fever greater than 101.0 F.  Be alert for signs of infection including redness, streaking, odor, fever or chills. Be alert for excessive pain or bleeding and notify your surgeon immediately.  WOUND INSTRUCTIONS:   Leave your dressing, cast, or splint in place until your post operative visit.  Keep it clean and dry.  Always keep the incision clean and dry until the staples/sutures are removed. If there is no drainage from the incision you should keep it open to air. If there is drainage from the incision you must keep it covered at all times until the drainage stops  Do not soak in a bath tub, hot tub, pool, lake or other body of water until 21 days after your surgery and your incision is completely dry and healed.  If you have removable sutures (or staples) they must be removed 10-14  days (unless otherwise instructed) from the day of your surgery.     1)  Elevate the extremity as much as possible.  2)  Keep the dressing clean and dry.  3)  Please call us if the dressing becomes wet or dirty.  4)  If you are experiencing worsening pain or worsening swelling, please call.     MEDICATIONS: Resume all previous home medications at the previous prescribed dose and frequency unless otherwise noted Start taking the  pain medications on an as-needed basis as prescribed  Please taper down pain medication over the next week following surgery.  Ideally you should not require a refill of any narcotic pain medication.  Take pain medication with food to minimize nausea. In addition to the prescribed pain medication, you may take over-the-counter pain relievers such as Tylenol.  Do NOT take additional tylenol if your pain medication already has tylenol in it.  Aspirin 325mg  daily for four weeks.      FOLLOWUP INSTRUCTIONS: 1. Follow up at the Physical Therapy Clinic 3-4 days following surgery. This appointment should be scheduled unless other arrangements have been made.The Physical Therapy scheduling number is [phone] if an appointment has not already been arranged.  2. Contact Dr. Eddie Dibbles office during office hours at [phone] or the practice after hours line at [phone] for non-emergencies. For medical emergencies call 911.   Discharge Location: Home

## 2022-12-30 NOTE — Brief Op Note (Signed)
   Brief Op Note  Date of Surgery: 12/30/2022  Preoperative Diagnosis: RIGHT ANTERIOR CRUCIATE LIGAMENT TEAR  Postoperative Diagnosis: same  Procedure: Procedure(s): RIGHT KNEE ANTERIOR CRUCIATE LIGAMENT RECONSTRUCTION WITH QUADRICEPS TENDON AUTOGRAFT  Implants: Implant Name Type Inv. Item Serial No. Manufacturer Lot No. LRB No. Used Action  IMPLANT [employer] 10 - [id number] Orthopedic Implant IMPLANT QUADLINK SYSTEM 10  [place] [id number] Right 1 Implanted  SYSTEM IMPL ACL/PCL [employer]IV[employer] - [id number] Anchor SYSTEM IMPL ACL/PCL [place] [id number] Right 1 Implanted  ANCHOR BUTTON TIGHTROPE 14 - [id number] HUTMLY [place]le [employer] [place] [id number] Right 1 Implanted    Surgeons: Surgeon(s): Vanetta Mulders, MD  Anesthesia: General    Estimated Blood Loss: See anesthesia record  Complications: None  Condition to PACU: Stable  Yevonne Pax, MD 12/30/2022 3:02 PM

## 2022-12-30 NOTE — Op Note (Signed)
Date of Surgery: 12/30/2022  INDICATIONS: Mr. Jeffrey Bennett is a 37 y.o.-year-old male with right knee ACL tear that has failed physical therapy.  The risk and benefits of the procedure were discussed in detail and documented in the pre-operative evaluation.   PREOPERATIVE DIAGNOSIS: 1. Right knee anterior cruciate ligament tear  POSTOPERATIVE DIAGNOSIS: Same.  PROCEDURE: 1.  Right knee anterior cruciate ligament reconstruction with quadriceps tendon autograft  SURGEON: Yevonne Pax MD  ASSISTANT: Raynelle Fanning, ATC  ANESTHESIA:  general plus adductor canal  IV FLUIDS AND URINE: See anesthesia record.  ANTIBIOTICS: Ancef  ESTIMATED BLOOD LOSS: 100 mL.  IMPLANTS:  Implant Name Type Inv. Item Serial No. Manufacturer Lot No. LRB No. Used Action  IMPLANT QUADLINK SYSTEM 10 - [id number] Orthopedic Implant IMPLANT QUADLINK SYSTEM 10  [place] [id number] Right 1 Implanted  SYSTEM IMPL ACL/PCL SWIVILLOCK - [id number] Anchor SYSTEM IMPL ACL/PCL [employer] [id number] Right 1 Implanted  ANCHOR BUTTON TIGHTROPE 14 - [id number] Anchor ANCHOR BUTTON TIGHTROPE 14  [employer] [place] [id number] Right 1 Implanted    DRAINS: None  CULTURES: None  COMPLICATIONS: none  DESCRIPTION OF PROCEDURE:  Examination under anesthesia: A careful examination under anesthesia was performed.  Knee ROM motion was: -3  - 130 Lachman: Grade 3B Pivot Shift: Positive Posterior drawer: normal Varus stability in full extension: normal Varus stability in 30 degrees of flexion: normal Valgus stability in full extension: normal Valgus stability in 30 degrees of flexion: normal Posterolateral drawer: normal   Intra-operative findings: A thorough arthroscopic examination of the knee was performed.  The findings are: 1. Suprapatellar pouch: Normal 2. Undersurface of median ridge: Normal 3. Medial patellar facet: Normal 4. Lateral patellar facet: Normal 5. Trochlea: Normal 6. Lateral gutter/popliteus tendon:  Normal 7. Hoffa's fat pad: Normal 8. Medial gutter/plica: Normal 9. ACL: Partial tear involving the entire aspect of the posterior lateral bundle 10. PCL: Normal 11. Medial meniscus: Normal 12. Medial compartment cartilage: Normal 13. Lateral meniscus: Normal 14. Lateral compartment cartilage: Normal      I identified the patient in the pre-operative holding area.  I marked the operative knee with my initials. I reviewed the risks and benefits of the proposed surgical intervention and the patient wished to proceed. Anesthesia was then performed with regional block.  The patient was transferred to the operative suite and placed in the supine position with all bony prominences padded.     SCDs were placed on the non-operative lower extremity. Appropriate antibiotics was administered within 1 hour before incision. The operative extremity was then prepped and draped in standard fashion. A time out was performed confirming the correct extremity, correct patient and correct procedure. A tourniquet was placed but not inflated.  Final timeout was performed. Arthroscopy portals were marked and portals were established with an 11 blade.  A diagnostic arthroscopy was performed with findings above. The femoral and tibial ACL footprints were debrided of tissue and prepared for tunnel placement.    The quad tendon graft harvest was done next.  A 5-cm vertical incision was made over the quadriceps insertion. Care was taken not to extend proximally greater than 8cm to avoid disruption of muscle belly. The paratenon was resected. The Arthrex double blade 9 mm quad tendon graft cutter was used to cut a strip of quadriceps tendon. A traction was held with an alice clamp. A quadriceps tendon stripper/cutter was used to amputate the proximal graft at 65 mm with care not to violate the VMO muscle.  The free quadriceps tendon graft was placed on the Graft Station. The Graft Prep Attachments were placed on the base and  the SpeedWhip Rip-Stop technique was employed to secure the suture/tissue interface of the TightRopes with a Lyndon. A TightRope was used on the quadriceps graft for femoral fixation and a TightRope ABS for tibial fixation.   The graft measured 11 mm on the femoral side and 20m on tibial side.  At this time the retrograde flip cutter guide was introduced into the lateral femoral condyle at the center of the anatomic femoral ACL footprint.  This was drilled for socket depth of 20 mm at a width of 11 mm.  A fiber stick was passed and a passing suture was passed through the tunnel.  Arthroscopy confirmed intact backwall.   The tibial tunnel was created with an 11 mm barrel reamer at 55 degree obliquity in the coronal plane, centered at the ACL footprint as determined by the anterior horn of the lateral meniscus through an incision in the anteromedial tibia.  Care was taken to again not converge with our previous tunnel from the lateral meniscal root repair.  The graft was then pulled into the knee with a traction suture. The ACL TightRope button was confirmed to be deep to the IT band using fluoroscopy.  The graft was then pulled into place with the white suture until parked in the femoral tunnel.     The graft was then affixed distally under tension with the knee in extension. The TightRope ABS Button was loaded onto the loop. The white shortening strands were pulled in alternating fashion to advance the button to bone and tension the graft. The knee was then cycled and further tightened on the femoral and tibial sides. The graft was probed and found to be taut at all angles of flexion.  Lachman examination improved to 1A.  The proximal fixation/TightRope was tightened one last time to ensure maximal tightness and then excess suture removed with a knot cutter. The TightRope tails were fixed to the anterior tibia with a 4.75 mm SwiveLock distal to the ABS button for secondary fixation.   The  quadriceps tendon harvest incision was closed with 0- Vicryl in the quad tendon, and and interrupted inverted 2-0 Vicryl in subcutaneous tissue and a 3-0 nylon in the skin.  The other incisions were closed superficially with 2-0 vicryl and 3-0 nylon.Xeroform, gauze, webril, and Ace wrap, Iceman, and brace locked at 0 degrees were applied to the knee.     Instrument, sponge, and needle counts were correct prior to wound closure and at the conclusion of the case.   The patient awoke from anesthesia without difficulty and was transferred to the PACU in stable condition.      POSTOPERATIVE PLAN: He will be weightbearing as tolerated.  Knee immobilizer.  I will switch him to his hinged knee brace either at his first physical therapy visit or at his first postop visit.  I will see him back in 2 weeks for suture removal.  He will be placed on aspirin for blood clot prevention  SYevonne Pax MD 3:04 PM

## 2022-12-30 NOTE — Progress Notes (Signed)
Per pharmacist in [place] sensitivity for Trimox and Ancef is low and since there is no documented serious reaction it is ok for patient to have Ancef.

## 2022-12-30 NOTE — Anesthesia Postprocedure Evaluation (Signed)
Anesthesia Post Note  Patient: Jeffrey Bennett  Procedure(s) Performed: RIGHT KNEE ANTERIOR CRUCIATE LIGAMENT RECONSTRUCTION WITH QUADRICEPS TENDON AUTOGRAFT (Right: Knee)     Patient location during evaluation: PACU Anesthesia Type: General Level of consciousness: awake and alert and oriented Pain management: pain level controlled Vital Signs Assessment: post-procedure vital signs reviewed and stable Respiratory status: spontaneous breathing, nonlabored ventilation and respiratory function stable Cardiovascular status: blood pressure returned to baseline and stable Postop Assessment: no apparent nausea or vomiting Anesthetic complications: no   No notable events documented.  Last Vitals:  Vitals:   12/30/22 1627 12/30/22 1642  BP: (!) 175/99 (!) 163/101  Pulse: (!) 58 (!) 59  Resp: 16 16  Temp:  37.1 C  SpO2: 94% 95%    Last Pain:  Vitals:   12/30/22 1642  PainSc: 4                  Hyland Mollenkopf A.

## 2022-12-30 NOTE — Transfer of Care (Addendum)
Immediate Anesthesia Transfer of Care Note  Patient: Jeffrey Bennett  Procedure(s) Performed: RIGHT KNEE ANTERIOR CRUCIATE LIGAMENT RECONSTRUCTION WITH QUADRICEPS TENDON AUTOGRAFT (Right: Knee)  Patient Location: PACU  Anesthesia Type:General and Regional   Level of Consciousness: drowsy and patient cooperative  Airway & Oxygen Therapy: Patient Spontanous Breathing  Post-op Assessment: Report given to RN, Post -op Vital signs reviewed and stable, and Patient moving all extremities X 4  Post vital signs: Reviewed and stable  Last Vitals:  Vitals Value Taken Time  BP 176/90 12/30/22 1457  Temp    Pulse 86 12/30/22 1501  Resp 13 12/30/22 1501  SpO2 98 % 12/30/22 1501  Vitals shown include unvalidated device data.  Last Pain:  Vitals:   12/30/22 0921  PainSc: 0-No pain         Complications: No notable events documented.

## 2022-12-30 NOTE — Anesthesia Procedure Notes (Signed)
Anesthesia Regional Block: Adductor canal block   Pre-Anesthetic Checklist: , timeout performed,  Correct Patient, Correct Site, Correct Laterality,  Correct Procedure, Correct Position, site marked,  Risks and benefits discussed,  Surgical consent,  Pre-op evaluation,  At surgeon's request and post-op pain management  Laterality: Right  Prep: chloraprep       Needles:  Injection technique: Single-shot  Needle Type: Echogenic Stimulator Needle     Needle Length: 5cm  Needle Gauge: 22     Additional Needles:   Procedures:, nerve stimulator,,, ultrasound used (permanent image in chart),,    Narrative:  Start time: 12/30/2022 10:40 AM End time: 12/30/2022 10:46 AM Injection made incrementally with aspirations every 5 mL.  Performed by: Personally  Anesthesiologist: Janeece Riggers, MD  Additional Notes: Functioning IV was confirmed and monitors were applied.  A 1m 22ga Arrow echogenic stimulator needle was used. Sterile prep and drape,hand hygiene and sterile gloves were used. Ultrasound guidance: relevant anatomy identified, needle position confirmed, local anesthetic spread visualized around nerve(s)., vascular puncture avoided.  Image printed for medical record. Negative aspiration and negative test dose prior to incremental administration of local anesthetic. The patient tolerated the procedure well.

## 2022-12-30 NOTE — Interval H&P Note (Signed)
History and Physical Interval Note:  12/30/2022 10:31 AM  Jeffrey Bennett  has presented today for surgery, with the diagnosis of RIGHT ANTERIOR CRUCIATE LIGAMENT TEAR.  The various methods of treatment have been discussed with the patient and family. After consideration of risks, benefits and other options for treatment, the patient has consented to  Procedure(s): RIGHT KNEE ARTHROSCOPY WITH ANTERIOR CRUCIATE LIGAMENT (ACL) RECONSTRUCTION (Right) as a surgical intervention.  The patient's history has been reviewed, patient examined, no change in status, stable for surgery.  I have reviewed the patient's chart and labs.  Questions were answered to the patient's satisfaction.     Vanetta Mulders

## 2022-12-30 NOTE — Anesthesia Postprocedure Evaluation (Signed)
Anesthesia Post Note  Patient: Jeffrey Bennett  Procedure(s) Performed: RIGHT KNEE ANTERIOR CRUCIATE LIGAMENT RECONSTRUCTION WITH QUADRICEPS TENDON AUTOGRAFT (Right: Knee)     Patient location during evaluation: PACU Anesthesia Type: General Level of consciousness: awake and alert and oriented Pain management: pain level controlled Vital Signs Assessment: post-procedure vital signs reviewed and stable Respiratory status: spontaneous breathing, nonlabored ventilation and respiratory function stable Cardiovascular status: blood pressure returned to baseline and stable Postop Assessment: no apparent nausea or vomiting Anesthetic complications: no   No notable events documented.  Last Vitals:  Vitals:   12/30/22 1627 12/30/22 1642  BP: (!) 175/99 (!) 163/101  Pulse: (!) 58 (!) 59  Resp: 16 16  Temp:  37.1 C  SpO2: 94% 95%    Last Pain:  Vitals:   12/30/22 1642  PainSc: 4                  Jeffrey Bennett A.

## 2022-12-31 ENCOUNTER — Encounter (HOSPITAL_COMMUNITY): Payer: Self-pay | Admitting: Orthopaedic Surgery

## 2023-01-01 ENCOUNTER — Other Ambulatory Visit (HOSPITAL_BASED_OUTPATIENT_CLINIC_OR_DEPARTMENT_OTHER): Payer: Self-pay | Admitting: Orthopaedic Surgery

## 2023-01-01 MED ORDER — HYDROCODONE-ACETAMINOPHEN 5-325 MG PO TABS: 1.0000 | ORAL_TABLET | Freq: Four times a day (QID) | ORAL | 0 refills | Status: DC | PRN

## 2023-01-02 ENCOUNTER — Telehealth: Payer: Self-pay | Admitting: Nurse Practitioner

## 2023-01-02 NOTE — Telephone Encounter (Signed)
Received signed released to share information between [facility] and [facility]

## 2023-01-03 ENCOUNTER — Ambulatory Visit (HOSPITAL_BASED_OUTPATIENT_CLINIC_OR_DEPARTMENT_OTHER): Payer: Managed Care, Other (non HMO) | Attending: Orthopaedic Surgery | Admitting: Physical Therapy

## 2023-01-03 ENCOUNTER — Encounter (HOSPITAL_BASED_OUTPATIENT_CLINIC_OR_DEPARTMENT_OTHER): Payer: Self-pay | Admitting: Physical Therapy

## 2023-01-03 DIAGNOSIS — S83511D Sprain of anterior cruciate ligament of right knee, subsequent encounter: Secondary | ICD-10-CM | POA: Insufficient documentation

## 2023-01-03 DIAGNOSIS — M25561 Pain in right knee: Secondary | ICD-10-CM | POA: Insufficient documentation

## 2023-01-03 DIAGNOSIS — R2689 Other abnormalities of gait and mobility: Secondary | ICD-10-CM | POA: Insufficient documentation

## 2023-01-03 DIAGNOSIS — M25661 Stiffness of right knee, not elsewhere classified: Secondary | ICD-10-CM | POA: Insufficient documentation

## 2023-01-03 DIAGNOSIS — M6281 Muscle weakness (generalized): Secondary | ICD-10-CM | POA: Insufficient documentation

## 2023-01-05 ENCOUNTER — Encounter (HOSPITAL_BASED_OUTPATIENT_CLINIC_OR_DEPARTMENT_OTHER): Payer: Self-pay | Admitting: Physical Therapy
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
## 2023-01-09 ENCOUNTER — Encounter (HOSPITAL_BASED_OUTPATIENT_CLINIC_OR_DEPARTMENT_OTHER): Payer: Self-pay | Admitting: Physical Therapy

## 2023-01-09 ENCOUNTER — Ambulatory Visit (HOSPITAL_BASED_OUTPATIENT_CLINIC_OR_DEPARTMENT_OTHER): Payer: Managed Care, Other (non HMO) | Admitting: Orthopaedic Surgery

## 2023-01-09 ENCOUNTER — Ambulatory Visit (HOSPITAL_BASED_OUTPATIENT_CLINIC_OR_DEPARTMENT_OTHER): Payer: Managed Care, Other (non HMO) | Admitting: Physical Therapy

## 2023-01-09 ENCOUNTER — Other Ambulatory Visit (HOSPITAL_BASED_OUTPATIENT_CLINIC_OR_DEPARTMENT_OTHER): Payer: Self-pay

## 2023-01-09 DIAGNOSIS — M25561 Pain in right knee: Secondary | ICD-10-CM

## 2023-01-09 DIAGNOSIS — S83511A Sprain of anterior cruciate ligament of right knee, initial encounter: Secondary | ICD-10-CM

## 2023-01-09 DIAGNOSIS — M6281 Muscle weakness (generalized): Secondary | ICD-10-CM

## 2023-01-09 DIAGNOSIS — M25661 Stiffness of right knee, not elsewhere classified: Secondary | ICD-10-CM
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
## 2023-01-10 ENCOUNTER — Encounter (HOSPITAL_BASED_OUTPATIENT_CLINIC_OR_DEPARTMENT_OTHER): Payer: Managed Care, Other (non HMO) | Admitting: Orthopaedic Surgery

## 2023-01-10 ENCOUNTER — Encounter (HOSPITAL_BASED_OUTPATIENT_CLINIC_OR_DEPARTMENT_OTHER): Payer: Self-pay | Admitting: Physical Therapy

## 2023-01-15 ENCOUNTER — Encounter (HOSPITAL_BASED_OUTPATIENT_CLINIC_OR_DEPARTMENT_OTHER): Payer: Self-pay | Admitting: Orthopaedic Surgery

## 2023-01-16 ENCOUNTER — Ambulatory Visit (HOSPITAL_BASED_OUTPATIENT_CLINIC_OR_DEPARTMENT_OTHER): Payer: Managed Care, Other (non HMO) | Admitting: Physical Therapy

## 2023-01-16 ENCOUNTER — Encounter (HOSPITAL_BASED_OUTPATIENT_CLINIC_OR_DEPARTMENT_OTHER): Payer: Self-pay | Admitting: Physical Therapy

## 2023-01-16 DIAGNOSIS — M25561 Pain in right knee: Secondary | ICD-10-CM

## 2023-01-16 DIAGNOSIS — R2689 Other abnormalities of gait and mobility: Secondary | ICD-10-CM

## 2023-01-16 DIAGNOSIS — M25661 Stiffness of right knee, not elsewhere classified: Secondary | ICD-10-CM

## 2023-01-16 DIAGNOSIS — M6281 Muscle weakness (generalized): Secondary | ICD-10-CM
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
## 2023-01-17 ENCOUNTER — Encounter: Payer: Self-pay | Admitting: Gastroenterology

## 2023-01-17 ENCOUNTER — Encounter (HOSPITAL_BASED_OUTPATIENT_CLINIC_OR_DEPARTMENT_OTHER): Payer: Self-pay

## 2023-01-17 ENCOUNTER — Ambulatory Visit (INDEPENDENT_AMBULATORY_CARE_PROVIDER_SITE_OTHER): Payer: Managed Care, Other (non HMO) | Admitting: Gastroenterology

## 2023-01-17 VITALS — BP 130/80 | HR 67 | Ht 73.0 in | Wt 200.0 lb

## 2023-01-17 DIAGNOSIS — Z1211 Encounter for screening for malignant neoplasm of colon: Secondary | ICD-10-CM | POA: Diagnosis not present

## 2023-01-17 DIAGNOSIS — Z1212 Encounter for screening for malignant neoplasm of rectum: Secondary | ICD-10-CM
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
## 2023-01-21 ENCOUNTER — Other Ambulatory Visit (HOSPITAL_BASED_OUTPATIENT_CLINIC_OR_DEPARTMENT_OTHER): Payer: Self-pay | Admitting: Orthopaedic Surgery

## 2023-01-22 MED ORDER — HYDROCODONE-ACETAMINOPHEN 5-325 MG PO TABS: 1.0000 | ORAL_TABLET | Freq: Four times a day (QID) | ORAL | 0 refills | Status: AC | PRN

## 2023-01-23 ENCOUNTER — Ambulatory Visit (HOSPITAL_BASED_OUTPATIENT_CLINIC_OR_DEPARTMENT_OTHER): Payer: Managed Care, Other (non HMO) | Admitting: Orthopaedic Surgery

## 2023-01-23 ENCOUNTER — Encounter (HOSPITAL_BASED_OUTPATIENT_CLINIC_OR_DEPARTMENT_OTHER): Payer: Self-pay | Admitting: Physical Therapy

## 2023-01-23 ENCOUNTER — Ambulatory Visit (HOSPITAL_BASED_OUTPATIENT_CLINIC_OR_DEPARTMENT_OTHER): Payer: Managed Care, Other (non HMO) | Attending: Orthopaedic Surgery | Admitting: Physical Therapy

## 2023-01-23 DIAGNOSIS — R2689 Other abnormalities of gait and mobility: Secondary | ICD-10-CM | POA: Insufficient documentation

## 2023-01-23 DIAGNOSIS — M6281 Muscle weakness (generalized): Secondary | ICD-10-CM | POA: Diagnosis present

## 2023-01-23 DIAGNOSIS — M25661 Stiffness of right knee, not elsewhere classified: Secondary | ICD-10-CM | POA: Diagnosis present

## 2023-01-23 DIAGNOSIS — M25561 Pain in right knee: Secondary | ICD-10-CM | POA: Diagnosis present

## 2023-01-23 DIAGNOSIS — S83511A Sprain of anterior cruciate ligament of right knee, initial encounter: Secondary | ICD-10-CM
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
## 2023-01-24 ENCOUNTER — Encounter (HOSPITAL_BASED_OUTPATIENT_CLINIC_OR_DEPARTMENT_OTHER): Payer: Self-pay

## 2023-01-29 ENCOUNTER — Ambulatory Visit (HOSPITAL_BASED_OUTPATIENT_CLINIC_OR_DEPARTMENT_OTHER): Payer: Managed Care, Other (non HMO) | Admitting: Physical Therapy

## 2023-01-29 ENCOUNTER — Encounter (HOSPITAL_BASED_OUTPATIENT_CLINIC_OR_DEPARTMENT_OTHER): Payer: Self-pay | Admitting: Physical Therapy

## 2023-01-29 DIAGNOSIS — M6281 Muscle weakness (generalized): Secondary | ICD-10-CM

## 2023-01-29 DIAGNOSIS — M25561 Pain in right knee: Secondary | ICD-10-CM | POA: Diagnosis not present
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
## 2023-01-31 ENCOUNTER — Ambulatory Visit (HOSPITAL_BASED_OUTPATIENT_CLINIC_OR_DEPARTMENT_OTHER): Payer: Managed Care, Other (non HMO) | Admitting: Physical Therapy

## 2023-01-31 ENCOUNTER — Encounter (HOSPITAL_BASED_OUTPATIENT_CLINIC_OR_DEPARTMENT_OTHER): Payer: Self-pay

## 2023-02-05 ENCOUNTER — Ambulatory Visit (HOSPITAL_BASED_OUTPATIENT_CLINIC_OR_DEPARTMENT_OTHER): Payer: Managed Care, Other (non HMO)

## 2023-02-05 ENCOUNTER — Encounter (HOSPITAL_BASED_OUTPATIENT_CLINIC_OR_DEPARTMENT_OTHER): Payer: Self-pay | Admitting: Orthopaedic Surgery

## 2023-02-05 ENCOUNTER — Encounter (HOSPITAL_BASED_OUTPATIENT_CLINIC_OR_DEPARTMENT_OTHER): Payer: Self-pay

## 2023-02-05 DIAGNOSIS — M25561 Pain in right knee: Secondary | ICD-10-CM

## 2023-02-05 DIAGNOSIS — M25661 Stiffness of right knee, not elsewhere classified: Secondary | ICD-10-CM

## 2023-02-05 DIAGNOSIS — R2689 Other abnormalities of gait and mobility: Secondary | ICD-10-CM

## 2023-02-05 DIAGNOSIS — M6281 Muscle weakness (generalized): Secondary | ICD-10-CM
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
## 2023-02-07 ENCOUNTER — Encounter (HOSPITAL_BASED_OUTPATIENT_CLINIC_OR_DEPARTMENT_OTHER): Payer: Self-pay | Admitting: Physical Therapy

## 2023-02-07 ENCOUNTER — Ambulatory Visit (HOSPITAL_BASED_OUTPATIENT_CLINIC_OR_DEPARTMENT_OTHER): Payer: Managed Care, Other (non HMO) | Admitting: Physical Therapy

## 2023-02-07 DIAGNOSIS — M25561 Pain in right knee: Secondary | ICD-10-CM

## 2023-02-07 DIAGNOSIS — M25661 Stiffness of right knee, not elsewhere classified: Secondary | ICD-10-CM

## 2023-02-07 DIAGNOSIS — M6281 Muscle weakness (generalized): Secondary | ICD-10-CM
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
## 2023-02-11 ENCOUNTER — Other Ambulatory Visit (HOSPITAL_BASED_OUTPATIENT_CLINIC_OR_DEPARTMENT_OTHER): Payer: Self-pay | Admitting: Orthopaedic Surgery

## 2023-02-12 ENCOUNTER — Encounter (HOSPITAL_BASED_OUTPATIENT_CLINIC_OR_DEPARTMENT_OTHER): Payer: Self-pay

## 2023-02-12 ENCOUNTER — Ambulatory Visit (HOSPITAL_BASED_OUTPATIENT_CLINIC_OR_DEPARTMENT_OTHER): Payer: Managed Care, Other (non HMO)

## 2023-02-12 DIAGNOSIS — M25561 Pain in right knee: Secondary | ICD-10-CM | POA: Diagnosis not present

## 2023-02-12 DIAGNOSIS — M6281 Muscle weakness (generalized): Secondary | ICD-10-CM

## 2023-02-12 DIAGNOSIS — R2689 Other abnormalities of gait and mobility: Secondary | ICD-10-CM

## 2023-02-12 DIAGNOSIS — M25661 Stiffness of right knee, not elsewhere classified: Secondary | ICD-10-CM
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
## 2023-02-24 ENCOUNTER — Ambulatory Visit (HOSPITAL_BASED_OUTPATIENT_CLINIC_OR_DEPARTMENT_OTHER): Payer: Managed Care, Other (non HMO) | Attending: Orthopaedic Surgery

## 2023-02-24 ENCOUNTER — Encounter (HOSPITAL_BASED_OUTPATIENT_CLINIC_OR_DEPARTMENT_OTHER): Payer: Self-pay

## 2023-02-24 DIAGNOSIS — M6281 Muscle weakness (generalized): Secondary | ICD-10-CM | POA: Insufficient documentation

## 2023-02-24 DIAGNOSIS — M25661 Stiffness of right knee, not elsewhere classified: Secondary | ICD-10-CM | POA: Insufficient documentation

## 2023-02-24 DIAGNOSIS — M25561 Pain in right knee: Secondary | ICD-10-CM | POA: Diagnosis present

## 2023-02-24 DIAGNOSIS — R2689 Other abnormalities of gait and mobility: Secondary | ICD-10-CM | POA: Diagnosis present
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
## 2023-02-26 ENCOUNTER — Ambulatory Visit (HOSPITAL_BASED_OUTPATIENT_CLINIC_OR_DEPARTMENT_OTHER): Payer: Managed Care, Other (non HMO) | Admitting: Orthopaedic Surgery

## 2023-02-26 ENCOUNTER — Encounter (HOSPITAL_BASED_OUTPATIENT_CLINIC_OR_DEPARTMENT_OTHER): Payer: Self-pay

## 2023-02-26 ENCOUNTER — Ambulatory Visit (HOSPITAL_BASED_OUTPATIENT_CLINIC_OR_DEPARTMENT_OTHER): Payer: Managed Care, Other (non HMO)

## 2023-02-26 DIAGNOSIS — M25561 Pain in right knee: Secondary | ICD-10-CM

## 2023-02-26 DIAGNOSIS — M6281 Muscle weakness (generalized): Secondary | ICD-10-CM

## 2023-02-26 DIAGNOSIS — M25661 Stiffness of right knee, not elsewhere classified: Secondary | ICD-10-CM

## 2023-02-26 DIAGNOSIS — S83511A Sprain of anterior cruciate ligament of right knee, initial encounter: Secondary | ICD-10-CM
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
## 2023-03-04 ENCOUNTER — Encounter (HOSPITAL_BASED_OUTPATIENT_CLINIC_OR_DEPARTMENT_OTHER): Payer: Self-pay

## 2023-03-04 ENCOUNTER — Ambulatory Visit (HOSPITAL_BASED_OUTPATIENT_CLINIC_OR_DEPARTMENT_OTHER): Payer: Managed Care, Other (non HMO)

## 2023-03-04 DIAGNOSIS — M25561 Pain in right knee: Secondary | ICD-10-CM | POA: Diagnosis not present

## 2023-03-04 DIAGNOSIS — R2689 Other abnormalities of gait and mobility: Secondary | ICD-10-CM

## 2023-03-04 DIAGNOSIS — M25661 Stiffness of right knee, not elsewhere classified: Secondary | ICD-10-CM

## 2023-03-04 DIAGNOSIS — M6281 Muscle weakness (generalized): Secondary | ICD-10-CM
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
## 2023-03-06 ENCOUNTER — Ambulatory Visit (HOSPITAL_BASED_OUTPATIENT_CLINIC_OR_DEPARTMENT_OTHER): Payer: Managed Care, Other (non HMO)

## 2023-03-06 ENCOUNTER — Encounter (HOSPITAL_BASED_OUTPATIENT_CLINIC_OR_DEPARTMENT_OTHER): Payer: Self-pay

## 2023-03-06 DIAGNOSIS — M25561 Pain in right knee: Secondary | ICD-10-CM

## 2023-03-06 DIAGNOSIS — M6281 Muscle weakness (generalized): Secondary | ICD-10-CM

## 2023-03-06 DIAGNOSIS — M25661 Stiffness of right knee, not elsewhere classified: Secondary | ICD-10-CM
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
## 2023-03-10 ENCOUNTER — Ambulatory Visit (HOSPITAL_BASED_OUTPATIENT_CLINIC_OR_DEPARTMENT_OTHER): Payer: Managed Care, Other (non HMO) | Admitting: Physical Therapy

## 2023-03-10 ENCOUNTER — Encounter (HOSPITAL_BASED_OUTPATIENT_CLINIC_OR_DEPARTMENT_OTHER): Payer: Self-pay | Admitting: Physical Therapy

## 2023-03-10 DIAGNOSIS — M6281 Muscle weakness (generalized): Secondary | ICD-10-CM

## 2023-03-10 DIAGNOSIS — M25561 Pain in right knee: Secondary | ICD-10-CM | POA: Diagnosis not present

## 2023-03-10 DIAGNOSIS — M25661 Stiffness of right knee, not elsewhere classified: Secondary | ICD-10-CM

## 2023-03-10 DIAGNOSIS — R2689 Other abnormalities of gait and mobility: Secondary | ICD-10-CM
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
## 2023-03-12 ENCOUNTER — Ambulatory Visit (HOSPITAL_BASED_OUTPATIENT_CLINIC_OR_DEPARTMENT_OTHER): Payer: Managed Care, Other (non HMO) | Attending: Orthopaedic Surgery

## 2023-03-12 ENCOUNTER — Encounter (HOSPITAL_BASED_OUTPATIENT_CLINIC_OR_DEPARTMENT_OTHER): Payer: Self-pay

## 2023-03-12 DIAGNOSIS — M6281 Muscle weakness (generalized): Secondary | ICD-10-CM | POA: Diagnosis present

## 2023-03-12 DIAGNOSIS — R2689 Other abnormalities of gait and mobility: Secondary | ICD-10-CM | POA: Insufficient documentation

## 2023-03-12 DIAGNOSIS — M25561 Pain in right knee: Secondary | ICD-10-CM | POA: Diagnosis present

## 2023-03-12 DIAGNOSIS — M25661 Stiffness of right knee, not elsewhere classified: Secondary | ICD-10-CM | POA: Diagnosis present
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
## 2023-03-17 ENCOUNTER — Ambulatory Visit (HOSPITAL_BASED_OUTPATIENT_CLINIC_OR_DEPARTMENT_OTHER): Payer: Managed Care, Other (non HMO)

## 2023-03-17 ENCOUNTER — Encounter (HOSPITAL_BASED_OUTPATIENT_CLINIC_OR_DEPARTMENT_OTHER): Payer: Self-pay

## 2023-03-17 DIAGNOSIS — M6281 Muscle weakness (generalized): Secondary | ICD-10-CM

## 2023-03-17 DIAGNOSIS — M25661 Stiffness of right knee, not elsewhere classified: Secondary | ICD-10-CM

## 2023-03-17 DIAGNOSIS — M25561 Pain in right knee: Secondary | ICD-10-CM
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
## 2023-03-19 ENCOUNTER — Ambulatory Visit (HOSPITAL_BASED_OUTPATIENT_CLINIC_OR_DEPARTMENT_OTHER): Payer: Managed Care, Other (non HMO) | Attending: Orthopaedic Surgery | Admitting: Physical Therapy

## 2023-03-19 DIAGNOSIS — M25561 Pain in right knee: Secondary | ICD-10-CM | POA: Diagnosis present

## 2023-03-19 DIAGNOSIS — M6281 Muscle weakness (generalized): Secondary | ICD-10-CM | POA: Diagnosis present

## 2023-03-19 DIAGNOSIS — M25661 Stiffness of right knee, not elsewhere classified: Secondary | ICD-10-CM | POA: Diagnosis present
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
## 2023-03-24 ENCOUNTER — Encounter (HOSPITAL_BASED_OUTPATIENT_CLINIC_OR_DEPARTMENT_OTHER): Payer: Self-pay

## 2023-03-24 ENCOUNTER — Ambulatory Visit (HOSPITAL_BASED_OUTPATIENT_CLINIC_OR_DEPARTMENT_OTHER): Payer: Managed Care, Other (non HMO)

## 2023-03-24 ENCOUNTER — Other Ambulatory Visit (HOSPITAL_BASED_OUTPATIENT_CLINIC_OR_DEPARTMENT_OTHER): Payer: Self-pay | Admitting: Orthopaedic Surgery

## 2023-03-24 DIAGNOSIS — M25561 Pain in right knee: Secondary | ICD-10-CM

## 2023-03-24 DIAGNOSIS — S83511A Sprain of anterior cruciate ligament of right knee, initial encounter: Secondary | ICD-10-CM

## 2023-03-24 DIAGNOSIS — M6281 Muscle weakness (generalized): Secondary | ICD-10-CM

## 2023-03-24 DIAGNOSIS — M25661 Stiffness of right knee, not elsewhere classified: Secondary | ICD-10-CM
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
## 2023-03-26 ENCOUNTER — Encounter (HOSPITAL_BASED_OUTPATIENT_CLINIC_OR_DEPARTMENT_OTHER): Payer: Self-pay | Admitting: Physical Therapy

## 2023-03-26 ENCOUNTER — Ambulatory Visit (HOSPITAL_BASED_OUTPATIENT_CLINIC_OR_DEPARTMENT_OTHER): Payer: Managed Care, Other (non HMO) | Admitting: Physical Therapy

## 2023-03-26 DIAGNOSIS — M25561 Pain in right knee: Secondary | ICD-10-CM | POA: Diagnosis not present

## 2023-03-26 DIAGNOSIS — M6281 Muscle weakness (generalized): Secondary | ICD-10-CM

## 2023-03-26 DIAGNOSIS — M25661 Stiffness of right knee, not elsewhere classified: Secondary | ICD-10-CM
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
## 2023-04-03 ENCOUNTER — Ambulatory Visit (HOSPITAL_BASED_OUTPATIENT_CLINIC_OR_DEPARTMENT_OTHER): Payer: Managed Care, Other (non HMO) | Admitting: Physical Therapy

## 2023-04-03 ENCOUNTER — Encounter (HOSPITAL_BASED_OUTPATIENT_CLINIC_OR_DEPARTMENT_OTHER): Payer: Self-pay | Admitting: Physical Therapy

## 2023-04-03 ENCOUNTER — Ambulatory Visit (HOSPITAL_BASED_OUTPATIENT_CLINIC_OR_DEPARTMENT_OTHER): Payer: No Typology Code available for payment source | Attending: Orthopaedic Surgery | Admitting: Physical Therapy

## 2023-04-03 DIAGNOSIS — M25561 Pain in right knee: Secondary | ICD-10-CM | POA: Diagnosis present

## 2023-04-03 DIAGNOSIS — S83511A Sprain of anterior cruciate ligament of right knee, initial encounter: Secondary | ICD-10-CM | POA: Diagnosis not present

## 2023-04-03 DIAGNOSIS — M25661 Stiffness of right knee, not elsewhere classified: Secondary | ICD-10-CM
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
## 2023-04-08 ENCOUNTER — Encounter (HOSPITAL_BASED_OUTPATIENT_CLINIC_OR_DEPARTMENT_OTHER): Payer: Self-pay

## 2023-04-08 ENCOUNTER — Ambulatory Visit (HOSPITAL_BASED_OUTPATIENT_CLINIC_OR_DEPARTMENT_OTHER): Payer: No Typology Code available for payment source

## 2023-04-08 DIAGNOSIS — M25561 Pain in right knee: Secondary | ICD-10-CM

## 2023-04-08 DIAGNOSIS — M25661 Stiffness of right knee, not elsewhere classified: Secondary | ICD-10-CM
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
## 2023-04-21 ENCOUNTER — Ambulatory Visit (HOSPITAL_BASED_OUTPATIENT_CLINIC_OR_DEPARTMENT_OTHER): Payer: Managed Care, Other (non HMO) | Attending: Orthopaedic Surgery

## 2023-04-21 ENCOUNTER — Encounter (HOSPITAL_BASED_OUTPATIENT_CLINIC_OR_DEPARTMENT_OTHER): Payer: Self-pay

## 2023-04-21 DIAGNOSIS — R2689 Other abnormalities of gait and mobility: Secondary | ICD-10-CM | POA: Insufficient documentation

## 2023-04-21 DIAGNOSIS — M25561 Pain in right knee: Secondary | ICD-10-CM | POA: Insufficient documentation

## 2023-04-21 DIAGNOSIS — M6281 Muscle weakness (generalized): Secondary | ICD-10-CM | POA: Insufficient documentation

## 2023-04-21 DIAGNOSIS — G5793 Unspecified mononeuropathy of bilateral lower limbs: Secondary | ICD-10-CM | POA: Insufficient documentation

## 2023-04-21 DIAGNOSIS — M25661 Stiffness of right knee, not elsewhere classified: Secondary | ICD-10-CM | POA: Diagnosis present
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
## 2023-04-28 ENCOUNTER — Ambulatory Visit (HOSPITAL_BASED_OUTPATIENT_CLINIC_OR_DEPARTMENT_OTHER): Payer: Managed Care, Other (non HMO)

## 2023-04-28 ENCOUNTER — Encounter (HOSPITAL_BASED_OUTPATIENT_CLINIC_OR_DEPARTMENT_OTHER): Payer: Self-pay

## 2023-04-28 DIAGNOSIS — M6281 Muscle weakness (generalized): Secondary | ICD-10-CM

## 2023-04-28 DIAGNOSIS — M25561 Pain in right knee: Secondary | ICD-10-CM

## 2023-04-28 DIAGNOSIS — R2689 Other abnormalities of gait and mobility: Secondary | ICD-10-CM

## 2023-04-28 DIAGNOSIS — M25661 Stiffness of right knee, not elsewhere classified: Secondary | ICD-10-CM
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
## 2023-05-01 ENCOUNTER — Ambulatory Visit (HOSPITAL_BASED_OUTPATIENT_CLINIC_OR_DEPARTMENT_OTHER): Payer: Managed Care, Other (non HMO) | Admitting: Orthopaedic Surgery

## 2023-05-01 DIAGNOSIS — G5793 Unspecified mononeuropathy of bilateral lower limbs: Secondary | ICD-10-CM

## 2023-05-01 DIAGNOSIS — S83511A Sprain of anterior cruciate ligament of right knee, initial encounter: Secondary | ICD-10-CM | POA: Diagnosis not present
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
## 2023-05-02 ENCOUNTER — Encounter: Payer: Self-pay | Admitting: Neurology

## 2023-05-05 ENCOUNTER — Encounter (HOSPITAL_BASED_OUTPATIENT_CLINIC_OR_DEPARTMENT_OTHER): Payer: Self-pay

## 2023-05-05 ENCOUNTER — Ambulatory Visit (HOSPITAL_BASED_OUTPATIENT_CLINIC_OR_DEPARTMENT_OTHER): Payer: Managed Care, Other (non HMO)

## 2023-05-05 ENCOUNTER — Other Ambulatory Visit (HOSPITAL_BASED_OUTPATIENT_CLINIC_OR_DEPARTMENT_OTHER): Payer: Self-pay | Admitting: Orthopaedic Surgery

## 2023-05-05 DIAGNOSIS — M6281 Muscle weakness (generalized): Secondary | ICD-10-CM

## 2023-05-05 DIAGNOSIS — M25661 Stiffness of right knee, not elsewhere classified: Secondary | ICD-10-CM

## 2023-05-05 DIAGNOSIS — M25561 Pain in right knee: Secondary | ICD-10-CM

## 2023-05-05 DIAGNOSIS — G5793 Unspecified mononeuropathy of bilateral lower limbs: Secondary | ICD-10-CM

## 2023-05-05 DIAGNOSIS — R2689 Other abnormalities of gait and mobility: Secondary | ICD-10-CM
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
## 2023-05-12 ENCOUNTER — Encounter (HOSPITAL_BASED_OUTPATIENT_CLINIC_OR_DEPARTMENT_OTHER): Payer: Self-pay | Admitting: Physical Therapy

## 2023-05-12 ENCOUNTER — Ambulatory Visit (HOSPITAL_BASED_OUTPATIENT_CLINIC_OR_DEPARTMENT_OTHER): Payer: Managed Care, Other (non HMO) | Admitting: Physical Therapy

## 2023-05-12 ENCOUNTER — Telehealth: Payer: Self-pay | Admitting: Nurse Practitioner

## 2023-05-12 DIAGNOSIS — M25561 Pain in right knee: Secondary | ICD-10-CM

## 2023-05-12 DIAGNOSIS — M6281 Muscle weakness (generalized): Secondary | ICD-10-CM

## 2023-05-12 NOTE — Telephone Encounter (Signed)
Dismissal letter in [employer]or snapshot
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
## 2023-05-13 ENCOUNTER — Other Ambulatory Visit (HOSPITAL_BASED_OUTPATIENT_CLINIC_OR_DEPARTMENT_OTHER): Payer: Self-pay

## 2023-05-13 MED ORDER — ISOTRETINOIN 40 MG PO CAPS
40.0000 mg | ORAL_CAPSULE | Freq: Two times a day (BID) | ORAL | 0 refills | Status: DC
  Filled 2023-05-13: qty 60, 30d supply, fill #0

## 2023-05-14 ENCOUNTER — Other Ambulatory Visit (HOSPITAL_BASED_OUTPATIENT_CLINIC_OR_DEPARTMENT_OTHER): Payer: Self-pay

## 2023-05-19 ENCOUNTER — Ambulatory Visit (HOSPITAL_BASED_OUTPATIENT_CLINIC_OR_DEPARTMENT_OTHER): Payer: Managed Care, Other (non HMO) | Attending: Orthopaedic Surgery

## 2023-05-19 ENCOUNTER — Encounter (HOSPITAL_BASED_OUTPATIENT_CLINIC_OR_DEPARTMENT_OTHER): Payer: Self-pay

## 2023-05-19 DIAGNOSIS — M25561 Pain in right knee: Secondary | ICD-10-CM | POA: Insufficient documentation

## 2023-05-19 DIAGNOSIS — R2689 Other abnormalities of gait and mobility: Secondary | ICD-10-CM | POA: Diagnosis present

## 2023-05-19 DIAGNOSIS — M25661 Stiffness of right knee, not elsewhere classified: Secondary | ICD-10-CM | POA: Diagnosis present

## 2023-05-19 DIAGNOSIS — M6281 Muscle weakness (generalized): Secondary | ICD-10-CM | POA: Diagnosis present
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
## 2023-05-28 ENCOUNTER — Ambulatory Visit (HOSPITAL_BASED_OUTPATIENT_CLINIC_OR_DEPARTMENT_OTHER): Payer: Managed Care, Other (non HMO)

## 2023-05-28 ENCOUNTER — Encounter (HOSPITAL_BASED_OUTPATIENT_CLINIC_OR_DEPARTMENT_OTHER): Payer: Self-pay

## 2023-05-30 ENCOUNTER — Ambulatory Visit (HOSPITAL_BASED_OUTPATIENT_CLINIC_OR_DEPARTMENT_OTHER): Payer: Managed Care, Other (non HMO)

## 2023-05-30 ENCOUNTER — Encounter (HOSPITAL_BASED_OUTPATIENT_CLINIC_OR_DEPARTMENT_OTHER): Payer: Self-pay

## 2023-05-30 DIAGNOSIS — M25661 Stiffness of right knee, not elsewhere classified: Secondary | ICD-10-CM

## 2023-05-30 DIAGNOSIS — R2689 Other abnormalities of gait and mobility: Secondary | ICD-10-CM

## 2023-05-30 DIAGNOSIS — M6281 Muscle weakness (generalized): Secondary | ICD-10-CM

## 2023-05-30 DIAGNOSIS — M25561 Pain in right knee: Secondary | ICD-10-CM

## 2023-05-30 NOTE — Addendum Note (Signed)
Addended by: Army Fossa C on: 05/30/2023 12:09 PM   Modules accepted: Orders
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
## 2023-06-04 ENCOUNTER — Encounter (HOSPITAL_BASED_OUTPATIENT_CLINIC_OR_DEPARTMENT_OTHER): Payer: Managed Care, Other (non HMO)

## 2023-06-06 ENCOUNTER — Ambulatory Visit (HOSPITAL_BASED_OUTPATIENT_CLINIC_OR_DEPARTMENT_OTHER): Payer: Managed Care, Other (non HMO)

## 2023-06-06 ENCOUNTER — Encounter (HOSPITAL_BASED_OUTPATIENT_CLINIC_OR_DEPARTMENT_OTHER): Payer: Self-pay

## 2023-06-06 DIAGNOSIS — M6281 Muscle weakness (generalized): Secondary | ICD-10-CM

## 2023-06-06 DIAGNOSIS — R2689 Other abnormalities of gait and mobility: Secondary | ICD-10-CM

## 2023-06-06 DIAGNOSIS — M25561 Pain in right knee: Secondary | ICD-10-CM | POA: Diagnosis not present

## 2023-06-06 DIAGNOSIS — M25661 Stiffness of right knee, not elsewhere classified: Secondary | ICD-10-CM
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
## 2023-06-09 ENCOUNTER — Ambulatory Visit (INDEPENDENT_AMBULATORY_CARE_PROVIDER_SITE_OTHER): Payer: Managed Care, Other (non HMO) | Admitting: Neurology

## 2023-06-09 ENCOUNTER — Encounter: Payer: Self-pay | Admitting: Neurology

## 2023-06-09 VITALS — BP 133/72 | HR 72 | Ht 73.0 in | Wt 205.0 lb

## 2023-06-09 DIAGNOSIS — M79671 Pain in right foot: Secondary | ICD-10-CM

## 2023-06-09 DIAGNOSIS — G8929 Other chronic pain: Secondary | ICD-10-CM

## 2023-06-09 DIAGNOSIS — M79672 Pain in left foot: Secondary | ICD-10-CM | POA: Diagnosis not present
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
## 2023-06-09 NOTE — Patient Instructions (Signed)
We will order nerve testing of both legs.   ELECTROMYOGRAM AND NERVE CONDUCTION STUDIES (EMG/NCS) INSTRUCTIONS  How to Prepare The neurologist conducting the EMG will need to know if you have certain medical conditions. Tell the neurologist and other EMG lab personnel if you: Have a pacemaker or any other electrical medical device Take blood-thinning medications Have hemophilia, a blood-clotting disorder that causes prolonged bleeding Bathing Take a shower or bath shortly before your exam in order to remove oils from your skin. Don't apply lotions or creams before the exam.  What to Expect You'll likely be asked to change into a hospital gown for the procedure and lie down on an examination table. The following explanations can help you understand what will happen during the exam.  Electrodes. The neurologist or a technician places surface electrodes at various locations on your skin depending on where you're experiencing symptoms. Or the neurologist may insert needle electrodes at different sites depending on your symptoms.  Sensations. The electrodes will at times transmit a tiny electrical current that you may feel as a twinge or spasm. The needle electrode may cause discomfort or pain that usually ends shortly after the needle is removed. If you are concerned about discomfort or pain, you may want to talk to the neurologist about taking a short break during the exam.  Instructions. During the needle EMG, the neurologist will assess whether there is any spontaneous electrical activity when the muscle is at rest - activity that isn't present in healthy muscle tissue - and the degree of activity when you slightly contract the muscle.  He or she will give you instructions on resting and contracting a muscle at appropriate times. Depending on what muscles and nerves the neurologist is examining, he or she may ask you to change positions during the exam.  After your EMG You may experience some  temporary, minor bruising where the needle electrode was inserted into your muscle. This bruising should fade within several days. If it persists, contact your primary care doctor.

## 2023-06-10 ENCOUNTER — Ambulatory Visit (HOSPITAL_BASED_OUTPATIENT_CLINIC_OR_DEPARTMENT_OTHER): Payer: Managed Care, Other (non HMO) | Attending: Orthopaedic Surgery

## 2023-06-10 ENCOUNTER — Encounter (HOSPITAL_BASED_OUTPATIENT_CLINIC_OR_DEPARTMENT_OTHER): Payer: Self-pay

## 2023-06-10 DIAGNOSIS — M25561 Pain in right knee: Secondary | ICD-10-CM | POA: Diagnosis present

## 2023-06-10 DIAGNOSIS — M6281 Muscle weakness (generalized): Secondary | ICD-10-CM | POA: Diagnosis present

## 2023-06-10 DIAGNOSIS — R2689 Other abnormalities of gait and mobility: Secondary | ICD-10-CM | POA: Diagnosis present

## 2023-06-10 DIAGNOSIS — M25661 Stiffness of right knee, not elsewhere classified: Secondary | ICD-10-CM | POA: Insufficient documentation
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
## 2023-06-12 ENCOUNTER — Encounter (HOSPITAL_BASED_OUTPATIENT_CLINIC_OR_DEPARTMENT_OTHER): Payer: Self-pay

## 2023-06-12 ENCOUNTER — Ambulatory Visit (HOSPITAL_BASED_OUTPATIENT_CLINIC_OR_DEPARTMENT_OTHER): Payer: Managed Care, Other (non HMO)

## 2023-06-12 DIAGNOSIS — R2689 Other abnormalities of gait and mobility: Secondary | ICD-10-CM

## 2023-06-12 DIAGNOSIS — M25561 Pain in right knee: Secondary | ICD-10-CM

## 2023-06-12 DIAGNOSIS — M25661 Stiffness of right knee, not elsewhere classified: Secondary | ICD-10-CM

## 2023-06-12 DIAGNOSIS — M6281 Muscle weakness (generalized): Secondary | ICD-10-CM
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
## 2023-06-16 ENCOUNTER — Ambulatory Visit (HOSPITAL_BASED_OUTPATIENT_CLINIC_OR_DEPARTMENT_OTHER): Payer: Managed Care, Other (non HMO)

## 2023-06-16 ENCOUNTER — Encounter (HOSPITAL_BASED_OUTPATIENT_CLINIC_OR_DEPARTMENT_OTHER): Payer: Self-pay

## 2023-06-16 DIAGNOSIS — M25561 Pain in right knee: Secondary | ICD-10-CM | POA: Diagnosis not present

## 2023-06-16 DIAGNOSIS — M25661 Stiffness of right knee, not elsewhere classified: Secondary | ICD-10-CM

## 2023-06-16 DIAGNOSIS — M6281 Muscle weakness (generalized): Secondary | ICD-10-CM

## 2023-06-16 DIAGNOSIS — R2689 Other abnormalities of gait and mobility: Secondary | ICD-10-CM
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
## 2023-06-19 ENCOUNTER — Encounter (HOSPITAL_BASED_OUTPATIENT_CLINIC_OR_DEPARTMENT_OTHER): Payer: Self-pay

## 2023-06-19 ENCOUNTER — Ambulatory Visit (HOSPITAL_BASED_OUTPATIENT_CLINIC_OR_DEPARTMENT_OTHER): Payer: Managed Care, Other (non HMO) | Attending: Orthopaedic Surgery

## 2023-06-19 DIAGNOSIS — R2689 Other abnormalities of gait and mobility: Secondary | ICD-10-CM

## 2023-06-19 DIAGNOSIS — M25561 Pain in right knee: Secondary | ICD-10-CM

## 2023-06-19 DIAGNOSIS — M25661 Stiffness of right knee, not elsewhere classified: Secondary | ICD-10-CM

## 2023-06-19 DIAGNOSIS — M6281 Muscle weakness (generalized): Secondary | ICD-10-CM
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
## 2023-06-23 ENCOUNTER — Ambulatory Visit (HOSPITAL_BASED_OUTPATIENT_CLINIC_OR_DEPARTMENT_OTHER): Payer: Managed Care, Other (non HMO) | Admitting: Physical Therapy

## 2023-06-23 ENCOUNTER — Encounter (HOSPITAL_BASED_OUTPATIENT_CLINIC_OR_DEPARTMENT_OTHER): Payer: Self-pay | Admitting: Physical Therapy

## 2023-06-23 DIAGNOSIS — M6281 Muscle weakness (generalized): Secondary | ICD-10-CM

## 2023-06-23 DIAGNOSIS — R2689 Other abnormalities of gait and mobility: Secondary | ICD-10-CM

## 2023-06-23 DIAGNOSIS — M25661 Stiffness of right knee, not elsewhere classified: Secondary | ICD-10-CM

## 2023-06-23 DIAGNOSIS — M25561 Pain in right knee: Secondary | ICD-10-CM
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
## 2023-06-25 ENCOUNTER — Encounter (HOSPITAL_BASED_OUTPATIENT_CLINIC_OR_DEPARTMENT_OTHER): Payer: Self-pay

## 2023-06-25 ENCOUNTER — Ambulatory Visit (HOSPITAL_BASED_OUTPATIENT_CLINIC_OR_DEPARTMENT_OTHER): Payer: Managed Care, Other (non HMO)

## 2023-06-25 DIAGNOSIS — R2689 Other abnormalities of gait and mobility: Secondary | ICD-10-CM

## 2023-06-25 DIAGNOSIS — M25661 Stiffness of right knee, not elsewhere classified: Secondary | ICD-10-CM

## 2023-06-25 DIAGNOSIS — M25561 Pain in right knee: Secondary | ICD-10-CM

## 2023-06-25 DIAGNOSIS — M6281 Muscle weakness (generalized): Secondary | ICD-10-CM
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
## 2023-06-27 ENCOUNTER — Telehealth: Payer: Self-pay | Admitting: Neurology

## 2023-06-27 ENCOUNTER — Ambulatory Visit (INDEPENDENT_AMBULATORY_CARE_PROVIDER_SITE_OTHER): Payer: Managed Care, Other (non HMO) | Admitting: Neurology

## 2023-06-27 DIAGNOSIS — G8929 Other chronic pain: Secondary | ICD-10-CM

## 2023-06-27 DIAGNOSIS — M79671 Pain in right foot: Secondary | ICD-10-CM

## 2023-06-27 DIAGNOSIS — M79672 Pain in left foot: Secondary | ICD-10-CM | POA: Diagnosis not present

## 2023-06-27 NOTE — Telephone Encounter (Signed)
Patient notified that NCS/EMG is normal.  No evidence of neuropathy causing feet pain. He will follow-up with orthopeadics ?plantar fasciitis.

## 2023-06-27 NOTE — Procedures (Signed)
[facility]  [address] [place], [address]  [place], [place] [address] Tel: [phone] Fax: [phone] Test Date:  06/27/2023  Patient: Jeffrey Bennett DOB: 29-Sep-1986 Physician: Nita Sickle, DO  Sex: Male Height: 6\' 1"  Ref Phys: Nita Sickle, DO  ID#: [id number]   Technician:    History: This is a 37 year old man referred for evaluation of bilateral feet pain.  NCV & EMG Findings: Electrodiagnostic testing of the right lower extremity and additional studies of the left shows: Bilateral sural and superficial peroneal sensory responses are within normal limits. Bilateral peroneal and tibial motor responses are within normal limits. Bilateral tibial H reflex studies are within normal limits, when adjusted for patient's height. There is no evidence of active or chronic motor axonal changes affecting any of the tested muscles.  Motor unit configuration and recruitment pattern is within normal limits.   Impression: This is a normal study of the lower extremities.  In particular, there is no evidence of a large fiber sensorimotor polyneuropathy or lumbosacral radiculopathy.    ___________________________ Nita Sickle, DO    Nerve Conduction Studies   Stim Site NR Peak (ms) Norm Peak (ms) O-P Amp (V) Norm O-P Amp  Left Sup Peroneal Anti Sensory (Ant Lat Mall)  32 C  12 cm    2.6 <4.5 13.5 >5  Right Sup Peroneal Anti Sensory (Ant Lat Mall)  32 C  12 cm    2.8 <4.5 10.5 >5  Left Sural Anti Sensory (Lat Mall)  32 C  Calf    2.9 <4.5 13.5 >5  Right Sural Anti Sensory (Lat Mall)  32 C  Calf    3.2 <4.5 13.7 >5     Stim Site NR Onset (ms) Norm Onset (ms) O-P Amp (mV) Norm O-P Amp Site1 Site2 Delta-0 (ms) Dist (cm) Vel (m/s) Norm Vel (m/s)  Left Peroneal Motor (Ext Dig Brev)  32 C  Ankle    4.5 <5.5 4.9 >3 B Fib Ankle 9.6 40.0 42 >40  B Fib    14.1  4.7  Poplt B Fib 1.8 10.0 56 >40  Poplt    15.9  4.6         Right Peroneal Motor (Ext Dig Brev)  32 C  Ankle     4.4 <5.5 4.5 >3 B Fib Ankle 9.6 44.0 46 >40  B Fib    14.0  3.7  Poplt B Fib 1.9 10.0 53 >40  Poplt    15.9  3.6         Left Tibial Motor (Abd Hall Brev)  32 C  Ankle    4.2 <6.0 9.9 >8 Knee Ankle 9.9 44.0 44 >40  Knee    14.1  5.3         Right Tibial Motor (Abd Hall Brev)  32 C  Ankle    4.8 <6.0 9.5 >8 Knee Ankle 11.1 46.0 41 >40  Knee    15.9  7.7          Electromyography   Side Muscle Ins.Act Fibs Fasc Recrt Amp Dur Poly Activation Comment  Right AntTibialis Nml Nml Nml Nml Nml Nml Nml Nml N/A  Right Gastroc Nml Nml Nml Nml Nml Nml Nml Nml N/A  Right Flex Dig Long Nml Nml Nml Nml Nml Nml Nml Nml N/A  Right RectFemoris Nml Nml Nml Nml Nml Nml Nml Nml N/A  Right BicepsFemS Nml Nml Nml Nml Nml Nml Nml Nml N/A  Left AntTibialis Nml Nml Nml Nml Nml Nml  Nml Nml N/A  Left Gastroc Nml Nml Nml Nml Nml Nml Nml Nml N/A  Left Flex Dig Long Nml Nml Nml Nml Nml Nml Nml Nml N/A  Left RectFemoris Nml Nml Nml Nml Nml Nml Nml Nml N/A  Left BicepsFemS Nml Nml Nml Nml Nml Nml Nml Nml N/A      Waveforms:

## 2023-07-02 ENCOUNTER — Ambulatory Visit (HOSPITAL_BASED_OUTPATIENT_CLINIC_OR_DEPARTMENT_OTHER): Payer: Managed Care, Other (non HMO) | Admitting: Orthopaedic Surgery

## 2023-07-02 DIAGNOSIS — M25561 Pain in right knee: Secondary | ICD-10-CM | POA: Diagnosis not present

## 2023-07-02 DIAGNOSIS — M25562 Pain in left knee: Secondary | ICD-10-CM | POA: Diagnosis not present

## 2023-07-02 DIAGNOSIS — E8889 Other specified metabolic disorders: Secondary | ICD-10-CM

## 2023-07-02 MED ORDER — TRIAMCINOLONE ACETONIDE 40 MG/ML IJ SUSP
80.0000 mg | INTRAMUSCULAR | Status: AC | PRN
Start: 2023-07-02 — End: 2023-07-02
  Administered 2023-07-02: 80 mg via INTRA_ARTICULAR

## 2023-07-02 MED ORDER — LIDOCAINE HCL 1 % IJ SOLN
4.0000 mL | INTRAMUSCULAR | Status: AC | PRN
Start: 2023-07-02 — End: 2023-07-02
  Administered 2023-07-02: 4 mL
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
## 2023-07-04 ENCOUNTER — Ambulatory Visit (HOSPITAL_BASED_OUTPATIENT_CLINIC_OR_DEPARTMENT_OTHER): Payer: Managed Care, Other (non HMO)

## 2023-07-04 ENCOUNTER — Encounter (HOSPITAL_BASED_OUTPATIENT_CLINIC_OR_DEPARTMENT_OTHER): Payer: Self-pay

## 2023-07-04 DIAGNOSIS — M25561 Pain in right knee: Secondary | ICD-10-CM | POA: Diagnosis not present

## 2023-07-04 DIAGNOSIS — M6281 Muscle weakness (generalized): Secondary | ICD-10-CM

## 2023-07-04 DIAGNOSIS — R2689 Other abnormalities of gait and mobility: Secondary | ICD-10-CM

## 2023-07-04 DIAGNOSIS — M25661 Stiffness of right knee, not elsewhere classified: Secondary | ICD-10-CM
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
## 2023-07-12 ENCOUNTER — Ambulatory Visit (HOSPITAL_BASED_OUTPATIENT_CLINIC_OR_DEPARTMENT_OTHER): Payer: Managed Care, Other (non HMO) | Admitting: Physical Therapy

## 2023-07-12 ENCOUNTER — Encounter (HOSPITAL_BASED_OUTPATIENT_CLINIC_OR_DEPARTMENT_OTHER): Payer: Self-pay | Admitting: Physical Therapy

## 2023-07-12 DIAGNOSIS — M25561 Pain in right knee: Secondary | ICD-10-CM | POA: Diagnosis not present

## 2023-07-12 DIAGNOSIS — M6281 Muscle weakness (generalized): Secondary | ICD-10-CM

## 2023-07-12 DIAGNOSIS — R2689 Other abnormalities of gait and mobility: Secondary | ICD-10-CM

## 2023-07-12 DIAGNOSIS — M25661 Stiffness of right knee, not elsewhere classified: Secondary | ICD-10-CM
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
## 2023-07-14 ENCOUNTER — Ambulatory Visit (HOSPITAL_BASED_OUTPATIENT_CLINIC_OR_DEPARTMENT_OTHER): Payer: Managed Care, Other (non HMO) | Admitting: Physical Therapy

## 2023-07-14 ENCOUNTER — Other Ambulatory Visit (HOSPITAL_BASED_OUTPATIENT_CLINIC_OR_DEPARTMENT_OTHER): Payer: Self-pay

## 2023-07-14 MED ORDER — ISOTRETINOIN 40 MG PO CAPS
40.0000 mg | ORAL_CAPSULE | Freq: Two times a day (BID) | ORAL | 0 refills | Status: DC
Start: 2023-07-14 — End: 2023-09-15
  Filled 2023-07-14 (×2): qty 60, 30d supply, fill #0

## 2023-07-15 ENCOUNTER — Ambulatory Visit (HOSPITAL_BASED_OUTPATIENT_CLINIC_OR_DEPARTMENT_OTHER): Payer: Managed Care, Other (non HMO)

## 2023-07-15 ENCOUNTER — Encounter (HOSPITAL_BASED_OUTPATIENT_CLINIC_OR_DEPARTMENT_OTHER): Payer: Self-pay

## 2023-07-15 DIAGNOSIS — R2689 Other abnormalities of gait and mobility: Secondary | ICD-10-CM

## 2023-07-15 DIAGNOSIS — M25561 Pain in right knee: Secondary | ICD-10-CM | POA: Diagnosis not present

## 2023-07-15 DIAGNOSIS — M6281 Muscle weakness (generalized): Secondary | ICD-10-CM

## 2023-07-15 DIAGNOSIS — M25661 Stiffness of right knee, not elsewhere classified: Secondary | ICD-10-CM
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
## 2023-07-17 ENCOUNTER — Ambulatory Visit (HOSPITAL_BASED_OUTPATIENT_CLINIC_OR_DEPARTMENT_OTHER): Payer: Managed Care, Other (non HMO)

## 2023-07-17 ENCOUNTER — Encounter (HOSPITAL_BASED_OUTPATIENT_CLINIC_OR_DEPARTMENT_OTHER): Payer: Self-pay

## 2023-07-17 DIAGNOSIS — M25561 Pain in right knee: Secondary | ICD-10-CM

## 2023-07-17 DIAGNOSIS — R2689 Other abnormalities of gait and mobility: Secondary | ICD-10-CM

## 2023-07-17 DIAGNOSIS — M25661 Stiffness of right knee, not elsewhere classified: Secondary | ICD-10-CM

## 2023-07-17 DIAGNOSIS — M6281 Muscle weakness (generalized): Secondary | ICD-10-CM
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
## 2023-07-23 ENCOUNTER — Encounter (HOSPITAL_BASED_OUTPATIENT_CLINIC_OR_DEPARTMENT_OTHER): Payer: Self-pay

## 2023-07-23 ENCOUNTER — Ambulatory Visit (HOSPITAL_BASED_OUTPATIENT_CLINIC_OR_DEPARTMENT_OTHER): Payer: Managed Care, Other (non HMO) | Attending: Orthopaedic Surgery

## 2023-07-23 DIAGNOSIS — R2689 Other abnormalities of gait and mobility: Secondary | ICD-10-CM

## 2023-07-23 DIAGNOSIS — M25561 Pain in right knee: Secondary | ICD-10-CM

## 2023-07-23 DIAGNOSIS — M25661 Stiffness of right knee, not elsewhere classified: Secondary | ICD-10-CM

## 2023-07-23 DIAGNOSIS — M6281 Muscle weakness (generalized): Secondary | ICD-10-CM | POA: Diagnosis present
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
## 2023-07-25 ENCOUNTER — Encounter (HOSPITAL_BASED_OUTPATIENT_CLINIC_OR_DEPARTMENT_OTHER): Payer: Self-pay

## 2023-07-25 ENCOUNTER — Ambulatory Visit (HOSPITAL_BASED_OUTPATIENT_CLINIC_OR_DEPARTMENT_OTHER): Payer: Managed Care, Other (non HMO)

## 2023-07-25 DIAGNOSIS — M6281 Muscle weakness (generalized): Secondary | ICD-10-CM

## 2023-07-25 DIAGNOSIS — M25561 Pain in right knee: Secondary | ICD-10-CM

## 2023-07-25 DIAGNOSIS — R2689 Other abnormalities of gait and mobility: Secondary | ICD-10-CM | POA: Diagnosis not present

## 2023-07-25 DIAGNOSIS — M25661 Stiffness of right knee, not elsewhere classified: Secondary | ICD-10-CM
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
## 2023-07-28 ENCOUNTER — Other Ambulatory Visit (HOSPITAL_BASED_OUTPATIENT_CLINIC_OR_DEPARTMENT_OTHER): Payer: Self-pay | Admitting: Orthopaedic Surgery

## 2023-07-28 ENCOUNTER — Encounter (HOSPITAL_BASED_OUTPATIENT_CLINIC_OR_DEPARTMENT_OTHER): Payer: Self-pay | Admitting: Orthopaedic Surgery

## 2023-07-28 ENCOUNTER — Ambulatory Visit: Payer: 59 | Admitting: Dermatology

## 2023-07-28 DIAGNOSIS — G5793 Unspecified mononeuropathy of bilateral lower limbs: Secondary | ICD-10-CM

## 2023-07-29 ENCOUNTER — Ambulatory Visit (HOSPITAL_BASED_OUTPATIENT_CLINIC_OR_DEPARTMENT_OTHER): Payer: Managed Care, Other (non HMO) | Admitting: Physical Therapy

## 2023-07-29 ENCOUNTER — Encounter (HOSPITAL_BASED_OUTPATIENT_CLINIC_OR_DEPARTMENT_OTHER): Payer: Self-pay | Admitting: Physical Therapy

## 2023-07-29 DIAGNOSIS — M6281 Muscle weakness (generalized): Secondary | ICD-10-CM

## 2023-07-29 DIAGNOSIS — R2689 Other abnormalities of gait and mobility: Secondary | ICD-10-CM | POA: Diagnosis not present

## 2023-07-29 DIAGNOSIS — M25661 Stiffness of right knee, not elsewhere classified: Secondary | ICD-10-CM
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
## 2023-07-31 ENCOUNTER — Encounter (HOSPITAL_BASED_OUTPATIENT_CLINIC_OR_DEPARTMENT_OTHER): Payer: Self-pay

## 2023-07-31 ENCOUNTER — Ambulatory Visit (HOSPITAL_BASED_OUTPATIENT_CLINIC_OR_DEPARTMENT_OTHER): Payer: Managed Care, Other (non HMO)

## 2023-07-31 DIAGNOSIS — M25661 Stiffness of right knee, not elsewhere classified: Secondary | ICD-10-CM

## 2023-07-31 DIAGNOSIS — R2689 Other abnormalities of gait and mobility: Secondary | ICD-10-CM | POA: Diagnosis not present

## 2023-07-31 DIAGNOSIS — M25561 Pain in right knee: Secondary | ICD-10-CM

## 2023-07-31 DIAGNOSIS — M6281 Muscle weakness (generalized): Secondary | ICD-10-CM
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
## 2023-08-05 ENCOUNTER — Ambulatory Visit (HOSPITAL_BASED_OUTPATIENT_CLINIC_OR_DEPARTMENT_OTHER): Payer: Managed Care, Other (non HMO)

## 2023-08-05 DIAGNOSIS — R2689 Other abnormalities of gait and mobility: Secondary | ICD-10-CM

## 2023-08-05 DIAGNOSIS — M25561 Pain in right knee: Secondary | ICD-10-CM

## 2023-08-05 DIAGNOSIS — M25661 Stiffness of right knee, not elsewhere classified: Secondary | ICD-10-CM

## 2023-08-05 DIAGNOSIS — M6281 Muscle weakness (generalized): Secondary | ICD-10-CM
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
## 2023-08-07 ENCOUNTER — Ambulatory Visit (HOSPITAL_BASED_OUTPATIENT_CLINIC_OR_DEPARTMENT_OTHER): Payer: Managed Care, Other (non HMO)

## 2023-08-11 ENCOUNTER — Ambulatory Visit (INDEPENDENT_AMBULATORY_CARE_PROVIDER_SITE_OTHER): Payer: Managed Care, Other (non HMO)

## 2023-08-11 ENCOUNTER — Ambulatory Visit (INDEPENDENT_AMBULATORY_CARE_PROVIDER_SITE_OTHER): Payer: Managed Care, Other (non HMO) | Admitting: Podiatry

## 2023-08-11 ENCOUNTER — Other Ambulatory Visit (HOSPITAL_BASED_OUTPATIENT_CLINIC_OR_DEPARTMENT_OTHER): Payer: Self-pay

## 2023-08-11 ENCOUNTER — Ambulatory Visit: Payer: Managed Care, Other (non HMO)

## 2023-08-11 DIAGNOSIS — Q6652 Congenital pes planus, left foot: Secondary | ICD-10-CM

## 2023-08-11 DIAGNOSIS — M722 Plantar fascial fibromatosis: Secondary | ICD-10-CM | POA: Diagnosis not present

## 2023-08-11 DIAGNOSIS — Q6651 Congenital pes planus, right foot: Secondary | ICD-10-CM | POA: Diagnosis not present

## 2023-08-11 MED ORDER — MELOXICAM 15 MG PO TABS
15.0000 mg | ORAL_TABLET | Freq: Every day | ORAL | 1 refills | Status: DC
  Filled 2023-08-11: qty 30, 30d supply, fill #0

## 2023-08-11 NOTE — Progress Notes (Signed)
Chief Complaint  Patient presents with   Foot Pain    Patient c/o left heel pain radiating to the achilles tendon. Patient also stated he has numbness and tingling sensation when he is working and walking for long period of time. He stated his feet are doing better because he has been taking it easy at work. He is interested in orthotics, has tried OTC orthotics and they cause more pain. Not diabetic.     Subjective:  37 y.o. male presenting today as a new patient for evaluation of bilateral foot pain.  Chronic and ongoing for several years.  Patient has recent ACL repair performed by Dr. Steward Drone RT knee.  Patient states that he is doing very well to the right knee.  He continues to have chronic bilateral foot pain and was referred here for evaluation.  He would also like to discuss possible orthotics.   Past Medical History:  Diagnosis Date   Arthritis    mild   Back pain 11/22/2021   Encounter for medical examination to establish care 08/21/2021   GERD (gastroesophageal reflux disease)    Heart murmur    "when I was young"   Intractable episodic tension-type headache 09/18/2021   Pain of upper abdomen 08/21/2021   Paronychia of right index finger 01/28/2022   Right leg pain 11/15/2021   Tinea versicolor 01/28/2022   Past Surgical History:  Procedure Laterality Date   APPENDECTOMY     KNEE ARTHROSCOPY WITH ANTERIOR CRUCIATE LIGAMENT (ACL) REPAIR Right 12/30/2022   Procedure: RIGHT KNEE ANTERIOR CRUCIATE LIGAMENT RECONSTRUCTION WITH QUADRICEPS TENDON AUTOGRAFT;  Surgeon: Huel Cote, MD;  Location: [facility] OR;  Service: Orthopedics;  Laterality: Right;   Allergies  Allergen Reactions   Trimox [Amoxicillin] Other (See Comments)    Unknown reaction childhood allergy. Has patient had a PCN reaction causing immediate rash, facial/tongue/throat swelling, SOB or lightheadedness with hypotension: Unknown Has patient had a PCN reaction causing severe rash involving mucus membranes or  skin necrosis: Unknown Has patient had a PCN reaction that required hospitalization: Unknown Has patient had a PCN reaction occurring within the last 10 years: Unknown If all of the above answers are "NO", then may proceed with Cephalosporin use.      Objective/Physical Exam General: The patient is alert and oriented x3 in no acute distress.  Dermatology: Skin is warm, dry and supple bilateral lower extremities. Negative for open lesions or macerations.  Vascular: Palpable pedal pulses bilaterally. No edema or erythema noted. Capillary refill within normal limits.  Neurological: Grossly intact via light touch  Musculoskeletal Exam: Range of motion within normal limits to all pedal and ankle joints bilateral. Muscle strength 5/5 in all groups bilateral.  Upon weightbearing there is a medial longitudinal arch collapse bilaterally. Remove foot valgus noted to the bilateral lower extremities with excessive pronation upon mid stance.  Radiographic Exam LT foot 08/11/2023:  Normal osseous mineralization. Joint spaces preserved. No fracture/dislocation/boney destruction.  Pes planus noted on radiographic exam lateral views. Decreased calcaneal inclination and metatarsal declination angle is noted. Anterior break in the cyma line noted on lateral views. Medial talar head to deviation noted on AP radiograph.   Assessment: 1. pes planus bilateral 2.  Bilateral plantar fasciitis   Plan of Care:  -Patient was evaluated. X-Rays reviewed.  -Explained to the patient that I do believe that the majority of his symptoms are coming from his flatfoot deformity.  Patient agrees.  He is very active and works on his feet  -I do  believe the patient would benefit from custom molded orthotics to support the medial longitudinal arch of the foot and alleviate his flatfoot symptoms as well as his heel pain.  Order placed -Appointment with orthotics department for custom orthotics -Prescription for meloxicam 15 mg  daily -Advised against going barefoot.  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 Felecia Shelling, DPM    [address]                                        [place], [place] [address]                Office [phone]  Fax [phone]

## 2023-08-11 NOTE — Progress Notes (Signed)
Patient was seen, measured for custom molded foot orthotics. Patient DX with PF and BIL pes Planus pain BIL feet and achilles  Patient will benefit from CFO's as they will help provide total contact to MLA's helping to better distribute body weight across BIL feet greater reducing plantar pressure and pain and to also encourage FF and RF alignment. 1/8" lifts added to greater reduce achilles stress and pain  Patient was scanned items to be ordered and fit when in  Financials signed  Addison Bailey Cped, CFo, CFm

## 2023-08-12 ENCOUNTER — Encounter (HOSPITAL_BASED_OUTPATIENT_CLINIC_OR_DEPARTMENT_OTHER): Payer: Self-pay

## 2023-08-12 ENCOUNTER — Ambulatory Visit (HOSPITAL_BASED_OUTPATIENT_CLINIC_OR_DEPARTMENT_OTHER): Payer: Managed Care, Other (non HMO)

## 2023-08-12 DIAGNOSIS — M25561 Pain in right knee: Secondary | ICD-10-CM

## 2023-08-12 DIAGNOSIS — M25661 Stiffness of right knee, not elsewhere classified: Secondary | ICD-10-CM

## 2023-08-12 DIAGNOSIS — M6281 Muscle weakness (generalized): Secondary | ICD-10-CM

## 2023-08-12 DIAGNOSIS — R2689 Other abnormalities of gait and mobility: Secondary | ICD-10-CM
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
## 2023-08-14 ENCOUNTER — Encounter (HOSPITAL_BASED_OUTPATIENT_CLINIC_OR_DEPARTMENT_OTHER): Payer: Managed Care, Other (non HMO) | Admitting: Physical Therapy

## 2023-08-19 ENCOUNTER — Ambulatory Visit (HOSPITAL_BASED_OUTPATIENT_CLINIC_OR_DEPARTMENT_OTHER): Payer: Managed Care, Other (non HMO) | Attending: Orthopaedic Surgery | Admitting: Physical Therapy

## 2023-08-19 ENCOUNTER — Encounter (HOSPITAL_BASED_OUTPATIENT_CLINIC_OR_DEPARTMENT_OTHER): Payer: Self-pay | Admitting: Physical Therapy

## 2023-08-19 DIAGNOSIS — R2689 Other abnormalities of gait and mobility: Secondary | ICD-10-CM | POA: Diagnosis present

## 2023-08-19 DIAGNOSIS — M25561 Pain in right knee: Secondary | ICD-10-CM | POA: Diagnosis present

## 2023-08-19 DIAGNOSIS — M6281 Muscle weakness (generalized): Secondary | ICD-10-CM | POA: Insufficient documentation

## 2023-08-19 DIAGNOSIS — M25661 Stiffness of right knee, not elsewhere classified: Secondary | ICD-10-CM | POA: Insufficient documentation
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
## 2023-08-21 ENCOUNTER — Encounter (HOSPITAL_BASED_OUTPATIENT_CLINIC_OR_DEPARTMENT_OTHER): Payer: Managed Care, Other (non HMO)

## 2023-09-05 ENCOUNTER — Ambulatory Visit (HOSPITAL_BASED_OUTPATIENT_CLINIC_OR_DEPARTMENT_OTHER): Payer: Managed Care, Other (non HMO) | Attending: Orthopaedic Surgery

## 2023-09-05 ENCOUNTER — Encounter (HOSPITAL_BASED_OUTPATIENT_CLINIC_OR_DEPARTMENT_OTHER): Payer: Self-pay

## 2023-09-05 DIAGNOSIS — M25561 Pain in right knee: Secondary | ICD-10-CM | POA: Insufficient documentation

## 2023-09-05 DIAGNOSIS — R2689 Other abnormalities of gait and mobility: Secondary | ICD-10-CM | POA: Insufficient documentation

## 2023-09-05 DIAGNOSIS — M25661 Stiffness of right knee, not elsewhere classified: Secondary | ICD-10-CM | POA: Diagnosis present

## 2023-09-05 DIAGNOSIS — M6281 Muscle weakness (generalized): Secondary | ICD-10-CM | POA: Insufficient documentation
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
## 2023-09-10 ENCOUNTER — Ambulatory Visit (HOSPITAL_BASED_OUTPATIENT_CLINIC_OR_DEPARTMENT_OTHER): Payer: Managed Care, Other (non HMO)

## 2023-09-15 ENCOUNTER — Other Ambulatory Visit (HOSPITAL_BASED_OUTPATIENT_CLINIC_OR_DEPARTMENT_OTHER): Payer: Self-pay

## 2023-09-15 MED ORDER — ISOTRETINOIN 40 MG PO CAPS
40.0000 mg | ORAL_CAPSULE | Freq: Two times a day (BID) | ORAL | 0 refills | Status: DC
Start: 2023-09-15 — End: 2023-10-29
  Filled 2023-09-15: qty 60, 30d supply, fill #0

## 2023-09-17 ENCOUNTER — Ambulatory Visit (HOSPITAL_BASED_OUTPATIENT_CLINIC_OR_DEPARTMENT_OTHER): Payer: Managed Care, Other (non HMO)

## 2023-09-17 ENCOUNTER — Encounter (HOSPITAL_BASED_OUTPATIENT_CLINIC_OR_DEPARTMENT_OTHER): Payer: Self-pay

## 2023-09-17 ENCOUNTER — Other Ambulatory Visit (HOSPITAL_BASED_OUTPATIENT_CLINIC_OR_DEPARTMENT_OTHER): Payer: Self-pay

## 2023-09-17 DIAGNOSIS — M25661 Stiffness of right knee, not elsewhere classified: Secondary | ICD-10-CM

## 2023-09-17 DIAGNOSIS — R2689 Other abnormalities of gait and mobility: Secondary | ICD-10-CM | POA: Diagnosis not present

## 2023-09-17 DIAGNOSIS — M25561 Pain in right knee: Secondary | ICD-10-CM

## 2023-09-17 DIAGNOSIS — M6281 Muscle weakness (generalized): Secondary | ICD-10-CM
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
## 2023-09-18 ENCOUNTER — Other Ambulatory Visit: Payer: Managed Care, Other (non HMO)

## 2023-09-25 ENCOUNTER — Ambulatory Visit (INDEPENDENT_AMBULATORY_CARE_PROVIDER_SITE_OTHER): Payer: Managed Care, Other (non HMO) | Admitting: Podiatry

## 2023-09-25 ENCOUNTER — Other Ambulatory Visit (HOSPITAL_BASED_OUTPATIENT_CLINIC_OR_DEPARTMENT_OTHER): Payer: Self-pay

## 2023-09-25 ENCOUNTER — Encounter: Payer: Self-pay | Admitting: Podiatry

## 2023-09-25 DIAGNOSIS — B070 Plantar wart: Secondary | ICD-10-CM | POA: Diagnosis not present

## 2023-09-25 DIAGNOSIS — M722 Plantar fascial fibromatosis: Secondary | ICD-10-CM | POA: Diagnosis not present

## 2023-09-25 MED ORDER — MELOXICAM 15 MG PO TABS
15.0000 mg | ORAL_TABLET | Freq: Every day | ORAL | 1 refills | Status: AC
Start: 2023-09-25 — End: 2024-02-07
  Filled 2023-09-25: qty 30, 30d supply, fill #0
  Filled 2023-10-29 – 2023-10-30 (×2): qty 30, 30d supply, fill #1
  Filled 2023-12-04: qty 30, 30d supply, fill #2
  Filled 2024-01-07: qty 30, 30d supply, fill #3

## 2023-09-25 NOTE — Patient Instructions (Addendum)
Use O'Keefe's foot cream for the dry skin  Take dressing off in 12 hours and wash the foot with soap and water. If it is hurting or becomes uncomfortable before the 8 hours, go ahead and remove the bandage and wash the area.  If it blisters, apply antibiotic ointment and a band-aid.   Monitor for any signs/symptoms of infection. Call the office immediately if any occur or go directly to the emergency room. Call with any questions/concerns.

## 2023-09-25 NOTE — Progress Notes (Signed)
  Subjective:  Patient ID: Jeffrey Bennett, male    DOB: 1986-04-26,  MRN: [id number]  Chief Complaint  Patient presents with   Plantar Fasciitis    PATIENT STATES THAT HE HAS A WART ON THE BOTTOM OF HIS RIGHT FOOT UNDER THE HALLUX , AND HAS PLANTAR FASCIITIS ON BILATERAL FEET . PATIENT IS TAKING MOBIC FOR PAIN .  PATIENT NEEDS RX WITH SOME MEDICATIONS     37 y.o. male presents with the above complaint. History confirmed with patient.  Patient is undergoing physical therapy for knee issues, he has been taking meloxicam as well but is out of it and needs a refill.  Also think he has a plantars wart on his right great toe  Objective:  Physical Exam: warm, good capillary refill, no trophic changes or ulcerative lesions, normal DP and PT pulses, normal sensory exam, and verruca plantaris plantar medial hallux.  Assessment:   1. Plantar fasciitis of left foot   2. Verruca plantaris      Plan:  Patient was evaluated and treated and all questions answered.  Continue therapy exercises for plantar fasciitis.  Continue and begin using his orthotics that were dispensed today by our orthotist.  Refill meloxicam sent to pharmacy   Discussed etiology and treatment of verruca plantaris in detail with the patient as well as multiple treatment options including blistering agents, chemotherapeutic agents, surgical excision, laser therapy and the indications and roles of the above.  Today, recommended treatment with Cantharone as noted in procedure note below.  Follow-up in 1 month for reevaluation  Procedure: Destruction of Lesion Location: Plantar medial hallux Instrumentation: 15 blade. Technique: Debridement of lesion to petechial bleeding. Aperture pad applied around lesion. Small amount of canthrone applied to the base of the lesion. Dressing: Dry, sterile, compression dressing. Disposition: Patient tolerated procedure well. Advised to leave dressing on for 12 hours. Thereafter patient to wash  the area with soap and water and applied band-aid. Off-loading pads dispensed. Patient to return in 3 weeks for follow-up.   Return if symptoms worsen or fail to improve.

## 2023-09-30 ENCOUNTER — Encounter (HOSPITAL_BASED_OUTPATIENT_CLINIC_OR_DEPARTMENT_OTHER): Payer: Self-pay

## 2023-09-30 ENCOUNTER — Ambulatory Visit (HOSPITAL_BASED_OUTPATIENT_CLINIC_OR_DEPARTMENT_OTHER): Payer: Managed Care, Other (non HMO) | Attending: Orthopaedic Surgery

## 2023-09-30 DIAGNOSIS — R2689 Other abnormalities of gait and mobility: Secondary | ICD-10-CM | POA: Diagnosis present

## 2023-09-30 DIAGNOSIS — M25561 Pain in right knee: Secondary | ICD-10-CM | POA: Diagnosis present

## 2023-09-30 DIAGNOSIS — M25661 Stiffness of right knee, not elsewhere classified: Secondary | ICD-10-CM | POA: Diagnosis present

## 2023-09-30 DIAGNOSIS — M6281 Muscle weakness (generalized): Secondary | ICD-10-CM | POA: Diagnosis present
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
## 2023-10-01 ENCOUNTER — Other Ambulatory Visit (HOSPITAL_BASED_OUTPATIENT_CLINIC_OR_DEPARTMENT_OTHER): Payer: Self-pay

## 2023-10-01 ENCOUNTER — Ambulatory Visit (HOSPITAL_BASED_OUTPATIENT_CLINIC_OR_DEPARTMENT_OTHER): Payer: Managed Care, Other (non HMO) | Admitting: Orthopaedic Surgery

## 2023-10-01 DIAGNOSIS — S83511A Sprain of anterior cruciate ligament of right knee, initial encounter: Secondary | ICD-10-CM | POA: Diagnosis not present

## 2023-10-01 MED ORDER — METHYLPREDNISOLONE 4 MG PO TBPK
ORAL_TABLET | ORAL | 0 refills | Status: AC
  Filled 2023-10-01: qty 21, 6d supply, fill #0
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
## 2023-10-06 ENCOUNTER — Other Ambulatory Visit (HOSPITAL_BASED_OUTPATIENT_CLINIC_OR_DEPARTMENT_OTHER): Payer: Self-pay

## 2023-10-06 MED ORDER — PREDNISONE 20 MG PO TABS
20.0000 mg | ORAL_TABLET | Freq: Every day | ORAL | 1 refills | Status: DC
  Filled 2023-10-06: qty 30, 30d supply, fill #0
  Filled 2023-11-01: qty 30, 30d supply, fill #1

## 2023-10-07 ENCOUNTER — Encounter (HOSPITAL_BASED_OUTPATIENT_CLINIC_OR_DEPARTMENT_OTHER): Payer: Self-pay | Admitting: Physical Therapy

## 2023-10-07 ENCOUNTER — Other Ambulatory Visit (HOSPITAL_BASED_OUTPATIENT_CLINIC_OR_DEPARTMENT_OTHER): Payer: Self-pay

## 2023-10-07 ENCOUNTER — Ambulatory Visit (HOSPITAL_BASED_OUTPATIENT_CLINIC_OR_DEPARTMENT_OTHER): Payer: Managed Care, Other (non HMO) | Admitting: Physical Therapy

## 2023-10-07 DIAGNOSIS — R2689 Other abnormalities of gait and mobility: Secondary | ICD-10-CM | POA: Diagnosis not present

## 2023-10-07 DIAGNOSIS — M25661 Stiffness of right knee, not elsewhere classified: Secondary | ICD-10-CM

## 2023-10-07 DIAGNOSIS — M6281 Muscle weakness (generalized): Secondary | ICD-10-CM
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
## 2023-10-13 ENCOUNTER — Other Ambulatory Visit: Payer: Managed Care, Other (non HMO)

## 2023-10-14 ENCOUNTER — Encounter (HOSPITAL_BASED_OUTPATIENT_CLINIC_OR_DEPARTMENT_OTHER): Payer: Self-pay | Admitting: Physical Therapy

## 2023-10-14 ENCOUNTER — Ambulatory Visit (HOSPITAL_BASED_OUTPATIENT_CLINIC_OR_DEPARTMENT_OTHER): Payer: Managed Care, Other (non HMO) | Admitting: Physical Therapy

## 2023-10-14 DIAGNOSIS — R2689 Other abnormalities of gait and mobility: Secondary | ICD-10-CM

## 2023-10-14 DIAGNOSIS — M25661 Stiffness of right knee, not elsewhere classified: Secondary | ICD-10-CM

## 2023-10-14 DIAGNOSIS — M6281 Muscle weakness (generalized): Secondary | ICD-10-CM
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
## 2023-10-21 ENCOUNTER — Encounter (HOSPITAL_BASED_OUTPATIENT_CLINIC_OR_DEPARTMENT_OTHER): Payer: Self-pay

## 2023-10-21 ENCOUNTER — Ambulatory Visit (HOSPITAL_BASED_OUTPATIENT_CLINIC_OR_DEPARTMENT_OTHER): Payer: Self-pay

## 2023-10-22 ENCOUNTER — Ambulatory Visit (HOSPITAL_BASED_OUTPATIENT_CLINIC_OR_DEPARTMENT_OTHER): Payer: Managed Care, Other (non HMO) | Admitting: Orthopaedic Surgery

## 2023-10-22 DIAGNOSIS — M7631 Iliotibial band syndrome, right leg: Secondary | ICD-10-CM | POA: Diagnosis not present

## 2023-10-22 DIAGNOSIS — M25561 Pain in right knee: Secondary | ICD-10-CM | POA: Diagnosis not present

## 2023-10-22 MED ORDER — LIDOCAINE HCL 1 % IJ SOLN
4.0000 mL | INTRAMUSCULAR | Status: AC | PRN
Start: 2023-10-22 — End: 2023-10-22
  Administered 2023-10-22: 4 mL

## 2023-10-22 MED ORDER — TRIAMCINOLONE ACETONIDE 40 MG/ML IJ SUSP
80.0000 mg | INTRAMUSCULAR | Status: AC | PRN
  Administered 2023-10-22: 80 mg via INTRA_ARTICULAR
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
## 2023-10-27 ENCOUNTER — Ambulatory Visit: Payer: Managed Care, Other (non HMO) | Admitting: Dermatology

## 2023-10-28 ENCOUNTER — Encounter (HOSPITAL_BASED_OUTPATIENT_CLINIC_OR_DEPARTMENT_OTHER): Payer: Managed Care, Other (non HMO) | Admitting: Physical Therapy

## 2023-10-29 ENCOUNTER — Other Ambulatory Visit: Payer: Self-pay

## 2023-10-29 ENCOUNTER — Other Ambulatory Visit (HOSPITAL_BASED_OUTPATIENT_CLINIC_OR_DEPARTMENT_OTHER): Payer: Self-pay

## 2023-10-29 MED ORDER — ISOTRETINOIN 40 MG PO CAPS
40.0000 mg | ORAL_CAPSULE | Freq: Two times a day (BID) | ORAL | 0 refills | Status: DC
  Filled 2023-10-29 – 2023-11-01 (×3): qty 60, 30d supply, fill #0

## 2023-10-29 MED ORDER — NYSTATIN-TRIAMCINOLONE 100000-0.1 UNIT/GM-% EX OINT
TOPICAL_OINTMENT | CUTANEOUS | 1 refills | Status: AC
  Filled 2023-10-29: qty 15, 30d supply, fill #0
  Filled 2023-12-04: qty 15, 7d supply, fill #1

## 2023-10-30 ENCOUNTER — Encounter (HOSPITAL_BASED_OUTPATIENT_CLINIC_OR_DEPARTMENT_OTHER): Payer: Self-pay | Admitting: Physical Therapy

## 2023-10-30 ENCOUNTER — Ambulatory Visit (HOSPITAL_BASED_OUTPATIENT_CLINIC_OR_DEPARTMENT_OTHER): Payer: Managed Care, Other (non HMO) | Attending: Orthopaedic Surgery | Admitting: Physical Therapy

## 2023-10-30 ENCOUNTER — Other Ambulatory Visit (HOSPITAL_BASED_OUTPATIENT_CLINIC_OR_DEPARTMENT_OTHER): Payer: Self-pay

## 2023-10-30 DIAGNOSIS — M25661 Stiffness of right knee, not elsewhere classified: Secondary | ICD-10-CM | POA: Diagnosis present

## 2023-10-30 DIAGNOSIS — M6281 Muscle weakness (generalized): Secondary | ICD-10-CM | POA: Insufficient documentation

## 2023-10-30 DIAGNOSIS — R2689 Other abnormalities of gait and mobility: Secondary | ICD-10-CM | POA: Insufficient documentation
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
## 2023-11-03 ENCOUNTER — Ambulatory Visit (INDEPENDENT_AMBULATORY_CARE_PROVIDER_SITE_OTHER): Payer: Managed Care, Other (non HMO) | Admitting: Podiatry

## 2023-11-03 ENCOUNTER — Encounter: Payer: Self-pay | Admitting: Podiatry

## 2023-11-03 ENCOUNTER — Telehealth (HOSPITAL_BASED_OUTPATIENT_CLINIC_OR_DEPARTMENT_OTHER): Payer: Self-pay

## 2023-11-03 DIAGNOSIS — B070 Plantar wart: Secondary | ICD-10-CM

## 2023-11-03 NOTE — Telephone Encounter (Deleted)
-----   Message from [facility] sent at 11/02/2023  8:14 AM EST ----- Can we get him in with jackson to send for uric acid and send off knee fluid for crystals? ----- Message ----- From: Army Fossa, PT Sent: 11/01/2023  12:57 PM EST To: Huel Cote, MD  The swelling really isn't all that bad. The weirdest thing is the sudden onset of severe pain that really doesn't follow any pattern and its a very generalized portion of the leg around the knee. ----- Message ----- From: Huel Cote, MD Sent: 10/30/2023   4:03 PM EST To: Army Fossa, PT  Is he swollen? If so, probably about time, last I saw him though he was doing great ----- Message ----- From: Army Fossa, PT Sent: 10/30/2023  10:14 AM EST To: Huel Cote, MD; Jeanella Cara, LAT  Hi! Are you still considering the gout test for him?

## 2023-11-03 NOTE — Progress Notes (Signed)
  Subjective:  Patient ID: Jeffrey Bennett, male    DOB: 12-30-1985,  MRN: [id number]  Chief Complaint  Patient presents with   Callouses    "It's doing pretty good.  The skin is just peeling and stuff like that."    37 y.o. male presents with the above complaint. History confirmed with patient.   Objective:  Physical Exam: warm, good capillary refill, no trophic changes or ulcerative lesions, normal DP and PT pulses, normal sensory exam, and verruca plantaris plantar medial hallux significantly improved in size and shape.  Assessment:   1. Verruca plantaris      Plan:  Patient was evaluated and treated and all questions answered.    Discussed etiology and treatment of verruca plantaris in detail with the patient as well as multiple treatment options including blistering agents, chemotherapeutic agents, surgical excision, laser therapy and the indications and roles of the above.  Today, recommended treatment with Cantharone as noted in procedure note below.  I expect this last treatment should resolve the lesion finally  Procedure: Destruction of Lesion Location: Plantar medial hallux Instrumentation: 15 blade. Technique: Debridement of lesion to petechial bleeding. Aperture pad applied around lesion. Small amount of canthrone applied to the base of the lesion. Dressing: Dry, sterile, compression dressing. Disposition: Patient tolerated procedure well. Advised to leave dressing on for 12 hours. Thereafter patient to wash the area with soap and water and applied band-aid. Off-loading pads dispensed. Patient to return in 3 weeks for follow-up.   No follow-ups on file.

## 2023-11-03 NOTE — Telephone Encounter (Signed)
Pt returned call. He is scheduled for wed and 9am

## 2023-11-03 NOTE — Telephone Encounter (Signed)
Per Dr. Steward Drone needs to see Jeffrey Bennett for uric acid and send off knee fluid for cystals   LVM for pt to CB to schedule this

## 2023-11-04 ENCOUNTER — Other Ambulatory Visit (HOSPITAL_COMMUNITY): Payer: Self-pay

## 2023-11-04 ENCOUNTER — Other Ambulatory Visit: Payer: Self-pay

## 2023-11-05 ENCOUNTER — Ambulatory Visit (INDEPENDENT_AMBULATORY_CARE_PROVIDER_SITE_OTHER): Payer: Managed Care, Other (non HMO) | Admitting: Student

## 2023-11-05 ENCOUNTER — Encounter (HOSPITAL_BASED_OUTPATIENT_CLINIC_OR_DEPARTMENT_OTHER): Payer: Self-pay | Admitting: Student

## 2023-11-05 DIAGNOSIS — G8929 Other chronic pain: Secondary | ICD-10-CM | POA: Diagnosis not present

## 2023-11-05 DIAGNOSIS — S83511A Sprain of anterior cruciate ligament of right knee, initial encounter: Secondary | ICD-10-CM

## 2023-11-05 DIAGNOSIS — M25561 Pain in right knee: Secondary | ICD-10-CM
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
## 2023-11-11 ENCOUNTER — Ambulatory Visit (HOSPITAL_BASED_OUTPATIENT_CLINIC_OR_DEPARTMENT_OTHER): Payer: Managed Care, Other (non HMO)

## 2023-11-17 ENCOUNTER — Other Ambulatory Visit: Payer: Self-pay

## 2023-11-25 ENCOUNTER — Encounter (HOSPITAL_BASED_OUTPATIENT_CLINIC_OR_DEPARTMENT_OTHER): Payer: Self-pay

## 2023-11-25 ENCOUNTER — Ambulatory Visit (HOSPITAL_BASED_OUTPATIENT_CLINIC_OR_DEPARTMENT_OTHER): Payer: Managed Care, Other (non HMO) | Attending: Orthopaedic Surgery

## 2023-11-25 DIAGNOSIS — M6281 Muscle weakness (generalized): Secondary | ICD-10-CM | POA: Insufficient documentation

## 2023-11-25 DIAGNOSIS — R2689 Other abnormalities of gait and mobility: Secondary | ICD-10-CM | POA: Insufficient documentation

## 2023-11-25 DIAGNOSIS — M25561 Pain in right knee: Secondary | ICD-10-CM | POA: Diagnosis present

## 2023-11-25 DIAGNOSIS — M25661 Stiffness of right knee, not elsewhere classified: Secondary | ICD-10-CM | POA: Diagnosis present
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
## 2023-12-04 ENCOUNTER — Other Ambulatory Visit: Payer: Self-pay

## 2023-12-04 ENCOUNTER — Other Ambulatory Visit (HOSPITAL_BASED_OUTPATIENT_CLINIC_OR_DEPARTMENT_OTHER): Payer: Self-pay

## 2023-12-04 MED ORDER — PREDNISONE 20 MG PO TABS
40.0000 mg | ORAL_TABLET | Freq: Every morning | ORAL | 0 refills | Status: AC
  Filled 2023-12-04: qty 10, 5d supply, fill #0

## 2023-12-05 ENCOUNTER — Other Ambulatory Visit (HOSPITAL_COMMUNITY): Payer: Self-pay

## 2023-12-09 ENCOUNTER — Other Ambulatory Visit (HOSPITAL_BASED_OUTPATIENT_CLINIC_OR_DEPARTMENT_OTHER): Payer: Self-pay

## 2023-12-09 ENCOUNTER — Ambulatory Visit (HOSPITAL_BASED_OUTPATIENT_CLINIC_OR_DEPARTMENT_OTHER): Payer: Managed Care, Other (non HMO) | Admitting: Physical Therapy

## 2023-12-09 ENCOUNTER — Encounter (HOSPITAL_BASED_OUTPATIENT_CLINIC_OR_DEPARTMENT_OTHER): Payer: Self-pay | Admitting: Physical Therapy

## 2023-12-09 DIAGNOSIS — R2689 Other abnormalities of gait and mobility: Secondary | ICD-10-CM

## 2023-12-09 DIAGNOSIS — M6281 Muscle weakness (generalized): Secondary | ICD-10-CM
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
## 2023-12-15 ENCOUNTER — Other Ambulatory Visit (HOSPITAL_BASED_OUTPATIENT_CLINIC_OR_DEPARTMENT_OTHER): Payer: Self-pay

## 2023-12-15 MED ORDER — ISOTRETINOIN 40 MG PO CAPS
40.0000 mg | ORAL_CAPSULE | Freq: Two times a day (BID) | ORAL | 0 refills | Status: AC
  Filled 2023-12-15 – 2024-03-09 (×3): qty 60, 30d supply, fill #0

## 2023-12-16 ENCOUNTER — Other Ambulatory Visit (HOSPITAL_BASED_OUTPATIENT_CLINIC_OR_DEPARTMENT_OTHER): Payer: Self-pay

## 2023-12-17 ENCOUNTER — Other Ambulatory Visit (HOSPITAL_BASED_OUTPATIENT_CLINIC_OR_DEPARTMENT_OTHER): Payer: Self-pay

## 2023-12-17 MED ORDER — ISOTRETINOIN 40 MG PO CAPS
40.0000 mg | ORAL_CAPSULE | Freq: Two times a day (BID) | ORAL | 0 refills | Status: AC
  Filled 2023-12-17: qty 60, 30d supply, fill #0

## 2023-12-18 ENCOUNTER — Other Ambulatory Visit (HOSPITAL_BASED_OUTPATIENT_CLINIC_OR_DEPARTMENT_OTHER): Payer: Self-pay

## 2023-12-22 ENCOUNTER — Other Ambulatory Visit (HOSPITAL_BASED_OUTPATIENT_CLINIC_OR_DEPARTMENT_OTHER): Payer: Self-pay

## 2023-12-22 MED ORDER — ISOTRETINOIN 40 MG PO CAPS
40.0000 mg | ORAL_CAPSULE | Freq: Two times a day (BID) | ORAL | 0 refills | Status: AC
  Filled 2024-01-07 – 2024-03-18 (×4): qty 60, 30d supply, fill #0

## 2023-12-23 ENCOUNTER — Encounter (HOSPITAL_BASED_OUTPATIENT_CLINIC_OR_DEPARTMENT_OTHER): Payer: Managed Care, Other (non HMO) | Admitting: Physical Therapy

## 2024-01-06 ENCOUNTER — Encounter (HOSPITAL_BASED_OUTPATIENT_CLINIC_OR_DEPARTMENT_OTHER): Payer: Managed Care, Other (non HMO)

## 2024-01-07 ENCOUNTER — Other Ambulatory Visit (HOSPITAL_COMMUNITY): Payer: Self-pay

## 2024-01-07 ENCOUNTER — Other Ambulatory Visit: Payer: Self-pay

## 2024-01-13 ENCOUNTER — Other Ambulatory Visit (HOSPITAL_COMMUNITY): Payer: Self-pay

## 2024-01-13 ENCOUNTER — Other Ambulatory Visit: Payer: Self-pay

## 2024-01-14 ENCOUNTER — Other Ambulatory Visit (HOSPITAL_COMMUNITY): Payer: Self-pay

## 2024-01-16 ENCOUNTER — Other Ambulatory Visit (HOSPITAL_COMMUNITY): Payer: Self-pay

## 2024-01-19 ENCOUNTER — Other Ambulatory Visit (HOSPITAL_BASED_OUTPATIENT_CLINIC_OR_DEPARTMENT_OTHER): Payer: Self-pay

## 2024-01-19 MED ORDER — ISOTRETINOIN 40 MG PO CAPS
40.0000 mg | ORAL_CAPSULE | Freq: Two times a day (BID) | ORAL | 0 refills | Status: AC
Start: 2024-01-05 — End: ?
  Filled 2024-01-19: qty 60, 30d supply, fill #0

## 2024-01-20 ENCOUNTER — Other Ambulatory Visit (HOSPITAL_BASED_OUTPATIENT_CLINIC_OR_DEPARTMENT_OTHER): Payer: Self-pay

## 2024-01-21 ENCOUNTER — Other Ambulatory Visit (HOSPITAL_BASED_OUTPATIENT_CLINIC_OR_DEPARTMENT_OTHER): Payer: Self-pay

## 2024-02-05 ENCOUNTER — Encounter (HOSPITAL_BASED_OUTPATIENT_CLINIC_OR_DEPARTMENT_OTHER): Payer: Self-pay | Admitting: Orthopaedic Surgery

## 2024-02-06 ENCOUNTER — Ambulatory Visit (INDEPENDENT_AMBULATORY_CARE_PROVIDER_SITE_OTHER): Admitting: Student

## 2024-02-06 DIAGNOSIS — S83511D Sprain of anterior cruciate ligament of right knee, subsequent encounter: Secondary | ICD-10-CM | POA: Diagnosis not present
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
## 2024-02-19 ENCOUNTER — Other Ambulatory Visit (HOSPITAL_BASED_OUTPATIENT_CLINIC_OR_DEPARTMENT_OTHER): Payer: Self-pay

## 2024-02-23 ENCOUNTER — Other Ambulatory Visit (HOSPITAL_BASED_OUTPATIENT_CLINIC_OR_DEPARTMENT_OTHER): Payer: Self-pay

## 2024-02-23 MED ORDER — ISOTRETINOIN 40 MG PO CAPS
40.0000 mg | ORAL_CAPSULE | Freq: Two times a day (BID) | ORAL | 0 refills | Status: AC
  Filled 2024-02-23: qty 60, 30d supply, fill #0

## 2024-02-24 ENCOUNTER — Other Ambulatory Visit (HOSPITAL_BASED_OUTPATIENT_CLINIC_OR_DEPARTMENT_OTHER): Payer: Self-pay

## 2024-03-09 ENCOUNTER — Other Ambulatory Visit: Payer: Self-pay

## 2024-03-10 ENCOUNTER — Other Ambulatory Visit (HOSPITAL_COMMUNITY): Payer: Self-pay

## 2024-03-13 ENCOUNTER — Other Ambulatory Visit (HOSPITAL_COMMUNITY): Payer: Self-pay

## 2024-03-17 ENCOUNTER — Other Ambulatory Visit (HOSPITAL_COMMUNITY): Payer: Self-pay

## 2024-03-18 ENCOUNTER — Other Ambulatory Visit (HOSPITAL_BASED_OUTPATIENT_CLINIC_OR_DEPARTMENT_OTHER): Payer: Self-pay

## 2024-03-22 ENCOUNTER — Other Ambulatory Visit (HOSPITAL_COMMUNITY): Payer: Self-pay

## 2024-03-23 ENCOUNTER — Other Ambulatory Visit (HOSPITAL_BASED_OUTPATIENT_CLINIC_OR_DEPARTMENT_OTHER): Payer: Self-pay

## 2024-03-23 ENCOUNTER — Other Ambulatory Visit (HOSPITAL_COMMUNITY): Payer: Self-pay

## 2024-03-23 MED ORDER — NYSTATIN-TRIAMCINOLONE 100000-0.1 UNIT/GM-% EX OINT
1.0000 | TOPICAL_OINTMENT | Freq: Every day | CUTANEOUS | 1 refills | Status: AC | PRN
Start: 2024-03-23 — End: ?
  Filled 2024-03-23: qty 15, 30d supply, fill #0
  Filled 2024-04-21: qty 30, 30d supply, fill #0
  Filled 2024-06-11: qty 15, 15d supply, fill #0

## 2024-03-23 MED ORDER — ISOTRETINOIN 30 MG PO CAPS
30.0000 mg | ORAL_CAPSULE | Freq: Two times a day (BID) | ORAL | 0 refills | Status: AC
  Filled 2024-03-23: qty 30, 30d supply, fill #0
  Filled 2024-03-24: qty 60, 30d supply, fill #0

## 2024-03-24 ENCOUNTER — Other Ambulatory Visit (HOSPITAL_BASED_OUTPATIENT_CLINIC_OR_DEPARTMENT_OTHER): Payer: Self-pay

## 2024-03-24 MED ORDER — ISOTRETINOIN 40 MG PO CAPS
40.0000 mg | ORAL_CAPSULE | Freq: Two times a day (BID) | ORAL | 0 refills | Status: AC
  Filled 2024-03-24: qty 30, 30d supply, fill #0

## 2024-04-02 ENCOUNTER — Other Ambulatory Visit (HOSPITAL_BASED_OUTPATIENT_CLINIC_OR_DEPARTMENT_OTHER): Payer: Self-pay

## 2024-04-20 ENCOUNTER — Other Ambulatory Visit (HOSPITAL_BASED_OUTPATIENT_CLINIC_OR_DEPARTMENT_OTHER): Payer: Self-pay

## 2024-04-20 MED ORDER — ISOTRETINOIN 40 MG PO CAPS
ORAL_CAPSULE | ORAL | 0 refills | Status: AC
  Filled 2024-04-21: qty 30, 30d supply, fill #0

## 2024-04-21 ENCOUNTER — Other Ambulatory Visit (HOSPITAL_BASED_OUTPATIENT_CLINIC_OR_DEPARTMENT_OTHER): Payer: Self-pay

## 2024-05-24 ENCOUNTER — Other Ambulatory Visit (HOSPITAL_BASED_OUTPATIENT_CLINIC_OR_DEPARTMENT_OTHER): Payer: Self-pay

## 2024-05-24 MED ORDER — ISOTRETINOIN 40 MG PO CAPS
40.0000 mg | ORAL_CAPSULE | ORAL | 0 refills | Status: AC
  Filled 2024-06-04: qty 30, 30d supply, fill #0
  Filled 2024-06-04: qty 60, 30d supply, fill #0

## 2024-06-04 ENCOUNTER — Other Ambulatory Visit (HOSPITAL_BASED_OUTPATIENT_CLINIC_OR_DEPARTMENT_OTHER): Payer: Self-pay

## 2024-06-11 ENCOUNTER — Other Ambulatory Visit (HOSPITAL_BASED_OUTPATIENT_CLINIC_OR_DEPARTMENT_OTHER): Payer: Self-pay

## 2024-07-29 ENCOUNTER — Other Ambulatory Visit (HOSPITAL_BASED_OUTPATIENT_CLINIC_OR_DEPARTMENT_OTHER): Payer: Self-pay

## 2024-07-29 MED ORDER — ISOTRETINOIN 40 MG PO CAPS
40.0000 mg | ORAL_CAPSULE | Freq: Two times a day (BID) | ORAL | 0 refills | Status: AC
  Filled 2024-07-29: qty 60, 30d supply, fill #0

## 2024-08-09 ENCOUNTER — Other Ambulatory Visit (HOSPITAL_BASED_OUTPATIENT_CLINIC_OR_DEPARTMENT_OTHER): Payer: Self-pay

## 2024-08-18 ENCOUNTER — Other Ambulatory Visit (HOSPITAL_COMMUNITY): Payer: Self-pay

## 2024-08-18 ENCOUNTER — Encounter (HOSPITAL_BASED_OUTPATIENT_CLINIC_OR_DEPARTMENT_OTHER): Payer: Self-pay

## 2024-08-18 ENCOUNTER — Other Ambulatory Visit (HOSPITAL_BASED_OUTPATIENT_CLINIC_OR_DEPARTMENT_OTHER): Payer: Self-pay

## 2024-08-18 ENCOUNTER — Other Ambulatory Visit: Payer: Self-pay

## 2024-08-18 MED ORDER — DOXYCYCLINE HYCLATE 100 MG PO CAPS
100.0000 mg | ORAL_CAPSULE | Freq: Two times a day (BID) | ORAL | 0 refills | Status: DC
  Filled 2024-08-18 (×2): qty 20, 10d supply, fill #0

## 2024-09-07 ENCOUNTER — Other Ambulatory Visit (HOSPITAL_BASED_OUTPATIENT_CLINIC_OR_DEPARTMENT_OTHER): Payer: Self-pay

## 2024-09-07 ENCOUNTER — Other Ambulatory Visit: Payer: Self-pay

## 2024-09-07 MED ORDER — DOXYCYCLINE HYCLATE 100 MG PO CAPS
100.0000 mg | ORAL_CAPSULE | Freq: Two times a day (BID) | ORAL | 1 refills | Status: AC
  Filled 2024-09-07 (×2): qty 14, 7d supply, fill #0

## 2024-09-20 ENCOUNTER — Encounter: Payer: Self-pay | Admitting: Radiology

## 2024-11-01 ENCOUNTER — Other Ambulatory Visit (HOSPITAL_BASED_OUTPATIENT_CLINIC_OR_DEPARTMENT_OTHER): Payer: Self-pay

## 2024-11-01 MED ORDER — DOXYCYCLINE HYCLATE 100 MG PO TABS
100.0000 mg | ORAL_TABLET | Freq: Two times a day (BID) | ORAL | 3 refills | Status: AC
  Filled 2024-11-01: qty 60, 30d supply, fill #0

## 2024-11-01 MED ORDER — PREDNISONE 20 MG PO TABS
ORAL_TABLET | ORAL | 0 refills | Status: AC
  Filled 2024-11-01: qty 15, 5d supply, fill #0
